# Patient Record
Sex: Female | Born: 1955 | Race: White | Hispanic: No | Smoking: Never smoker
Health system: Southern US, Community
[De-identification: ages and names within clinical notes are randomized; demographics above are authoritative.]

## PROBLEM LIST (undated history)

## (undated) DIAGNOSIS — M7731 Calcaneal spur, right foot: Secondary | ICD-10-CM

## (undated) DIAGNOSIS — Z923 Personal history of irradiation: Secondary | ICD-10-CM

## (undated) DIAGNOSIS — C801 Malignant (primary) neoplasm, unspecified: Secondary | ICD-10-CM

## (undated) DIAGNOSIS — Z853 Personal history of malignant neoplasm of breast: Secondary | ICD-10-CM

## (undated) DIAGNOSIS — E079 Disorder of thyroid, unspecified: Secondary | ICD-10-CM

## (undated) DIAGNOSIS — N6019 Diffuse cystic mastopathy of unspecified breast: Secondary | ICD-10-CM

## (undated) DIAGNOSIS — E039 Hypothyroidism, unspecified: Secondary | ICD-10-CM

## (undated) HISTORY — DX: Calcaneal spur, right foot: M77.31

## (undated) HISTORY — DX: Personal history of malignant neoplasm of breast: Z85.3

## (undated) HISTORY — DX: Malignant (primary) neoplasm, unspecified: C80.1

## (undated) HISTORY — DX: Diffuse cystic mastopathy of unspecified breast: N60.19

## (undated) HISTORY — DX: Disorder of thyroid, unspecified: E07.9

---
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
## 2004-12-28 ENCOUNTER — Ambulatory Visit: Payer: Self-pay | Admitting: Family Medicine

## 2005-05-21 HISTORY — PX: COLONOSCOPY: SHX174

## 2005-12-31 ENCOUNTER — Ambulatory Visit: Payer: Self-pay | Admitting: Family Medicine

## 2006-01-09 ENCOUNTER — Ambulatory Visit: Payer: Self-pay | Admitting: Gastroenterology

## 2006-05-21 HISTORY — PX: OTHER SURGICAL HISTORY: SHX169

## 2007-11-12 ENCOUNTER — Ambulatory Visit: Payer: Self-pay | Admitting: Family Medicine

## 2007-11-27 ENCOUNTER — Ambulatory Visit: Payer: Self-pay | Admitting: Family Medicine

## 2008-04-23 ENCOUNTER — Ambulatory Visit: Payer: Self-pay | Admitting: General Surgery

## 2008-05-21 DIAGNOSIS — E079 Disorder of thyroid, unspecified: Secondary | ICD-10-CM

## 2008-05-21 HISTORY — DX: Disorder of thyroid, unspecified: E07.9

## 2008-07-26 ENCOUNTER — Ambulatory Visit: Payer: Self-pay | Admitting: Family Medicine

## 2008-11-17 ENCOUNTER — Ambulatory Visit: Payer: Self-pay | Admitting: General Surgery

## 2009-01-31 ENCOUNTER — Observation Stay: Payer: Self-pay | Admitting: Internal Medicine

## 2009-02-10 ENCOUNTER — Ambulatory Visit: Payer: Self-pay | Admitting: Family Medicine

## 2009-05-23 ENCOUNTER — Ambulatory Visit: Payer: Self-pay | Admitting: General Surgery

## 2009-11-22 ENCOUNTER — Ambulatory Visit: Payer: Self-pay | Admitting: General Surgery

## 2010-05-21 DIAGNOSIS — C801 Malignant (primary) neoplasm, unspecified: Secondary | ICD-10-CM

## 2010-05-21 DIAGNOSIS — Z853 Personal history of malignant neoplasm of breast: Secondary | ICD-10-CM

## 2010-05-21 HISTORY — DX: Personal history of malignant neoplasm of breast: Z85.3

## 2010-05-21 HISTORY — PX: BREAST SURGERY: SHX581

## 2010-05-21 HISTORY — DX: Malignant (primary) neoplasm, unspecified: C80.1

## 2010-05-21 HISTORY — PX: BREAST BIOPSY: SHX20

## 2010-11-30 ENCOUNTER — Ambulatory Visit: Payer: Self-pay | Admitting: General Surgery

## 2010-12-04 ENCOUNTER — Ambulatory Visit: Payer: Self-pay | Admitting: General Surgery

## 2010-12-20 HISTORY — PX: BREAST LUMPECTOMY: SHX2

## 2010-12-21 ENCOUNTER — Ambulatory Visit: Payer: Self-pay | Admitting: General Surgery

## 2010-12-22 ENCOUNTER — Ambulatory Visit: Payer: Self-pay | Admitting: General Surgery

## 2010-12-25 LAB — PATHOLOGY REPORT

## 2011-01-01 ENCOUNTER — Ambulatory Visit: Payer: Self-pay | Admitting: Oncology

## 2011-01-20 ENCOUNTER — Ambulatory Visit: Payer: Self-pay | Admitting: Oncology

## 2011-02-19 ENCOUNTER — Ambulatory Visit: Payer: Self-pay | Admitting: Oncology

## 2011-06-04 ENCOUNTER — Ambulatory Visit: Payer: Self-pay | Admitting: Oncology

## 2011-06-21 ENCOUNTER — Ambulatory Visit: Payer: Self-pay | Admitting: General Surgery

## 2011-06-22 ENCOUNTER — Ambulatory Visit: Payer: Self-pay | Admitting: Oncology

## 2011-07-26 ENCOUNTER — Ambulatory Visit: Payer: Self-pay | Admitting: Oncology

## 2011-07-26 LAB — CBC CANCER CENTER
Basophil #: 0 x10 3/mm (ref 0.0–0.1)
Basophil %: 0.7 %
Eosinophil #: 0.2 x10 3/mm (ref 0.0–0.7)
Lymphocyte #: 1.6 x10 3/mm (ref 1.0–3.6)
Lymphocyte %: 29.2 %
MCH: 31.1 pg (ref 26.0–34.0)
MCHC: 34.1 g/dL (ref 32.0–36.0)
Monocyte %: 6.9 %
Neutrophil %: 60.1 %
Platelet: 228 x10 3/mm (ref 150–440)
RDW: 12.6 % (ref 11.5–14.5)

## 2011-07-26 LAB — COMPREHENSIVE METABOLIC PANEL
Albumin: 3.8 g/dL (ref 3.4–5.0)
Anion Gap: 7 (ref 7–16)
BUN: 19 mg/dL — ABNORMAL HIGH (ref 7–18)
Bilirubin,Total: 0.4 mg/dL (ref 0.2–1.0)
Calcium, Total: 9.3 mg/dL (ref 8.5–10.1)
Co2: 31 mmol/L (ref 21–32)
Creatinine: 1.19 mg/dL (ref 0.60–1.30)
EGFR (African American): >60
Glucose: 100 mg/dL — ABNORMAL HIGH (ref 65–99)
Osmolality: 284 (ref 275–301)
Potassium: 4.1 mmol/L (ref 3.5–5.1)
Sodium: 141 mmol/L (ref 136–145)

## 2011-07-27 LAB — CANCER ANTIGEN 27.29: CA 27.29: 21.4 U/mL (ref 0.0–38.6)

## 2011-08-20 ENCOUNTER — Ambulatory Visit: Payer: Self-pay | Admitting: Oncology

## 2011-12-04 ENCOUNTER — Ambulatory Visit: Payer: Self-pay | Admitting: Family Medicine

## 2011-12-24 ENCOUNTER — Ambulatory Visit: Payer: Self-pay | Admitting: General Surgery

## 2012-01-28 ENCOUNTER — Ambulatory Visit: Payer: Self-pay | Admitting: Oncology

## 2012-01-28 LAB — CBC CANCER CENTER
Basophil %: 0.7 %
Eosinophil #: 0.1 x10 3/mm (ref 0.0–0.7)
Eosinophil %: 2.3 %
HCT: 39.1 % (ref 35.0–47.0)
HGB: 12.8 g/dL (ref 12.0–16.0)
Lymphocyte %: 33.7 %
MCHC: 32.7 g/dL (ref 32.0–36.0)
Monocyte %: 5.8 %
Neutrophil #: 3.1 x10 3/mm (ref 1.4–6.5)
Neutrophil %: 57.5 %
RBC: 4.28 10*6/uL (ref 3.80–5.20)
RDW: 13 % (ref 11.5–14.5)

## 2012-01-28 LAB — COMPREHENSIVE METABOLIC PANEL
Albumin: 3.9 g/dL (ref 3.4–5.0)
Alkaline Phosphatase: 106 U/L (ref 50–136)
BUN: 12 mg/dL (ref 7–18)
Bilirubin,Total: 0.4 mg/dL (ref 0.2–1.0)
Chloride: 104 mmol/L (ref 98–107)
Creatinine: 0.93 mg/dL (ref 0.60–1.30)
Glucose: 98 mg/dL (ref 65–99)
SGPT (ALT): 29 U/L (ref 12–78)
Sodium: 142 mmol/L (ref 136–145)
Total Protein: 7.6 g/dL (ref 6.4–8.2)

## 2012-01-29 LAB — CANCER ANTIGEN 27.29: CA 27.29: 7.7 U/mL (ref 0.0–38.6)

## 2012-02-19 ENCOUNTER — Ambulatory Visit: Payer: Self-pay | Admitting: Oncology

## 2012-06-26 ENCOUNTER — Ambulatory Visit: Payer: Self-pay | Admitting: General Surgery

## 2012-07-01 ENCOUNTER — Ambulatory Visit: Payer: Self-pay | Admitting: General Surgery

## 2012-07-19 ENCOUNTER — Ambulatory Visit: Payer: Self-pay | Admitting: Oncology

## 2012-07-28 LAB — COMPREHENSIVE METABOLIC PANEL
BUN: 22 mg/dL — ABNORMAL HIGH (ref 7–18)
Calcium, Total: 9.3 mg/dL (ref 8.5–10.1)
Chloride: 103 mmol/L (ref 98–107)
Creatinine: 1 mg/dL (ref 0.60–1.30)
EGFR (Non-African Amer.): >60
Glucose: 95 mg/dL (ref 65–99)
Osmolality: 284 (ref 275–301)
Potassium: 4.4 mmol/L (ref 3.5–5.1)
Total Protein: 7.6 g/dL (ref 6.4–8.2)

## 2012-07-28 LAB — CBC CANCER CENTER
Eosinophil #: 0.1 x10 3/mm (ref 0.0–0.7)
Eosinophil %: 1.6 %
Lymphocyte #: 1.5 x10 3/mm (ref 1.0–3.6)
Lymphocyte %: 28.1 %
MCH: 29.9 pg (ref 26.0–34.0)
MCHC: 33.1 g/dL (ref 32.0–36.0)
MCV: 90 fL (ref 80–100)
Monocyte #: 0.5 x10 3/mm (ref 0.2–0.9)
Monocyte %: 9.1 %
Neutrophil #: 3.2 x10 3/mm (ref 1.4–6.5)
Neutrophil %: 60.2 %
RDW: 12.8 % (ref 11.5–14.5)

## 2012-07-29 LAB — CANCER ANTIGEN 27.29: CA 27.29: 11.7 U/mL (ref 0.0–38.6)

## 2012-08-19 ENCOUNTER — Ambulatory Visit: Payer: Self-pay | Admitting: Oncology

## 2012-12-30 ENCOUNTER — Encounter: Payer: Self-pay | Admitting: General Surgery

## 2012-12-30 ENCOUNTER — Ambulatory Visit: Payer: Self-pay | Admitting: General Surgery

## 2012-12-30 LAB — HM MAMMOGRAPHY: HM MAMMO: NORMAL (ref 0–4)

## 2013-01-13 ENCOUNTER — Ambulatory Visit (INDEPENDENT_AMBULATORY_CARE_PROVIDER_SITE_OTHER): Payer: BC Managed Care – PPO | Admitting: General Surgery

## 2013-01-13 ENCOUNTER — Encounter: Payer: Self-pay | Admitting: General Surgery

## 2013-01-13 VITALS — BP 144/80 | HR 76 | Resp 12 | Ht 63.0 in | Wt 189.0 lb

## 2013-01-13 DIAGNOSIS — Z853 Personal history of malignant neoplasm of breast: Secondary | ICD-10-CM

## 2013-01-13 NOTE — Patient Instructions (Addendum)
Patient to return in one year.Patient to take an baby aspirin daily.

## 2013-01-13 NOTE — Progress Notes (Signed)
Patient ID: Deanna Holder, female   DOB: 28-Jun-1955, 58 y.o.   MRN: [id number]  Chief Complaint  Patient presents with  . Follow-up    mammogram    HPI Deanna Holder is a 57 y.o. female who presents for a breast evaluation. The most recent mammogram was done on 12/30/12 cat 2.  Patient does perform regular self breast checks and gets regular mammograms done.    HPI  Past Medical History  Diagnosis Date  . 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 Diffuse cystic mastopathy   . Hypertension   . Thyroid disease 2010  . Personal history of malignant neoplasm of breast 2012    Past Surgical History  Procedure Laterality Date  . Colonoscopy  2007    Dr. Servando Snare  . Tooth extraction  1970  . Wisdom teeth removal  2008  . Cesarean section  1985  . Breast surgery Left 2012    mammosite balloon placement  . Breast lumpectomy Left Aug 2012    ER/PR POS,HER 2 NEG    Family History  Problem Relation Age of Onset  . Heart attack Father   . Thyroid disease Mother     Social History History  Substance Use Topics  . Smoking status: Never Smoker   . Smokeless tobacco: Never Used  . Alcohol Use: No    Allergies  Allergen Reactions  . Ampicillin Anaphylaxis  . Vibramycin [Doxycycline Calcium] Anaphylaxis    Current Outpatient Prescriptions  Medication Sig Dispense Refill  . atorvastatin (LIPITOR) 20 MG tablet Take 20 mg by mouth daily.       Marland Kitchen letrozole (FEMARA) 2.5 MG tablet Take 2.5 mg by mouth daily.       Marland Kitchen levothyroxine (SYNTHROID, LEVOTHROID) 50 MCG tablet Take 50 mcg by mouth daily before breakfast.       . Vitamin D, Ergocalciferol, (DRISDOL) 50000 UNITS CAPS capsule every 7 (seven) days.        No current facility-administered medications for this visit.    Review of Systems Review of Systems  Blood pressure 144/80, pulse 76, resp. rate 12, height 5\' 3"  (1.6 m), weight 189 lb (85.73 kg).  Physical Exam Physical Exam  Constitutional: She is oriented to person,  place, and time. She appears well-developed and well-nourished.  Eyes: Conjunctivae are normal. No scleral icterus.  Neck: Neck supple. No tracheal deviation present. No mass and no thyromegaly present.  Cardiovascular: Normal rate, regular rhythm, S1 normal, normal heart sounds, intact distal pulses and normal pulses.   Pulses:      Dorsalis pedis pulses are 2+ on the right side, and 2+ on the left side.       Posterior tibial pulses are 2+ on the right side, and 2+ on the left side.  No edema  Pulmonary/Chest: Breath sounds normal. Right breast exhibits no inverted nipple, no mass, no nipple discharge, no skin change and no tenderness. Left breast exhibits no inverted nipple, no mass, no nipple discharge, no skin change and no tenderness.  Left lumpectomy site little firmness upper inner quadrant.  Abdominal: Soft. Normal appearance and bowel sounds are normal. There is no hepatosplenomegaly. There is no tenderness.  Lymphadenopathy:    She has no cervical adenopathy.    She has no axillary adenopathy.  Neurological: She is alert and oriented to person, place, and time.  Skin: Skin is warm and dry.    Data Reviewed Mammogram reviewed   Assessment    Stable exam. 2 yrs post left  breast cancer treatment. Currently on Letrazole.    Plan  Patient to return in one year bilateral diagnotic mammogram.       [name] 01/13/2013, 8:58 PM

## 2013-01-26 ENCOUNTER — Ambulatory Visit: Payer: Self-pay | Admitting: Oncology

## 2013-01-26 LAB — CBC CANCER CENTER
Basophil #: 0 x10 3/mm (ref 0.0–0.1)
Eosinophil #: 0.2 x10 3/mm (ref 0.0–0.7)
HGB: 13.4 g/dL (ref 12.0–16.0)
MCH: 30.2 pg (ref 26.0–34.0)
MCV: 91 fL (ref 80–100)
Monocyte #: 0.5 x10 3/mm (ref 0.2–0.9)
Monocyte %: 8.6 %
Neutrophil #: 3.4 x10 3/mm (ref 1.4–6.5)
Platelet: 223 x10 3/mm (ref 150–440)
WBC: 5.7 x10 3/mm (ref 3.6–11.0)

## 2013-01-26 LAB — COMPREHENSIVE METABOLIC PANEL
Albumin: 3.9 g/dL (ref 3.4–5.0)
Alkaline Phosphatase: 143 U/L — ABNORMAL HIGH (ref 50–136)
Anion Gap: 7 (ref 7–16)
BUN: 15 mg/dL (ref 7–18)
Calcium, Total: 9.9 mg/dL (ref 8.5–10.1)
Chloride: 102 mmol/L (ref 98–107)
Creatinine: 0.99 mg/dL (ref 0.60–1.30)
EGFR (African American): >60
EGFR (Non-African Amer.): >60
Osmolality: 281 (ref 275–301)
Potassium: 4.7 mmol/L (ref 3.5–5.1)
SGPT (ALT): 23 U/L (ref 12–78)
Sodium: 140 mmol/L (ref 136–145)

## 2013-02-18 ENCOUNTER — Ambulatory Visit: Payer: Self-pay | Admitting: Oncology

## 2013-08-05 ENCOUNTER — Ambulatory Visit: Payer: Self-pay | Admitting: Oncology

## 2013-08-06 LAB — CBC CANCER CENTER
Basophil #: 0 x10 3/mm (ref 0.0–0.1)
Basophil %: 0.5 %
Eosinophil #: 0.1 x10 3/mm (ref 0.0–0.7)
Eosinophil %: 2 %
HCT: 40.2 % (ref 35.0–47.0)
HGB: 13.1 g/dL (ref 12.0–16.0)
LYMPHS ABS: 1.9 x10 3/mm (ref 1.0–3.6)
LYMPHS PCT: 32.3 %
MCH: 29.7 pg (ref 26.0–34.0)
MCHC: 32.5 g/dL (ref 32.0–36.0)
MCV: 91 fL (ref 80–100)
MONOS PCT: 10 %
Monocyte #: 0.6 x10 3/mm (ref 0.2–0.9)
Neutrophil #: 3.3 x10 3/mm (ref 1.4–6.5)
Neutrophil %: 55.2 %
PLATELETS: 232 x10 3/mm (ref 150–440)
RBC: 4.4 10*6/uL (ref 3.80–5.20)
RDW: 12.7 % (ref 11.5–14.5)
WBC: 5.9 x10 3/mm (ref 3.6–11.0)

## 2013-08-06 LAB — COMPREHENSIVE METABOLIC PANEL
ALT: 22 U/L (ref 12–78)
ANION GAP: 8 (ref 7–16)
Albumin: 3.7 g/dL (ref 3.4–5.0)
Alkaline Phosphatase: 117 U/L
BILIRUBIN TOTAL: 0.4 mg/dL (ref 0.2–1.0)
BUN: 17 mg/dL (ref 7–18)
CALCIUM: 9.7 mg/dL (ref 8.5–10.1)
CHLORIDE: 103 mmol/L (ref 98–107)
Co2: 30 mmol/L (ref 21–32)
Creatinine: 0.89 mg/dL (ref 0.60–1.30)
EGFR (African American): >60
Glucose: 101 mg/dL — ABNORMAL HIGH (ref 65–99)
Osmolality: 283 (ref 275–301)
Potassium: 4.3 mmol/L (ref 3.5–5.1)
SGOT(AST): 16 U/L (ref 15–37)
SODIUM: 141 mmol/L (ref 136–145)
TOTAL PROTEIN: 7.5 g/dL (ref 6.4–8.2)

## 2013-08-08 LAB — CANCER ANTIGEN 27.29: CA 27.29: 9.8 U/mL (ref 0.0–38.6)

## 2013-08-19 ENCOUNTER — Ambulatory Visit: Payer: Self-pay | Admitting: Oncology

## 2013-12-24 ENCOUNTER — Ambulatory Visit: Payer: Self-pay | Admitting: Family Medicine

## 2013-12-24 LAB — HM DEXA SCAN

## 2013-12-31 ENCOUNTER — Encounter: Payer: Self-pay | Admitting: General Surgery

## 2014-01-14 ENCOUNTER — Encounter: Payer: Self-pay | Admitting: General Surgery

## 2014-01-20 ENCOUNTER — Ambulatory Visit (INDEPENDENT_AMBULATORY_CARE_PROVIDER_SITE_OTHER): Payer: BC Managed Care – PPO | Admitting: General Surgery

## 2014-01-20 ENCOUNTER — Encounter: Payer: Self-pay | Admitting: General Surgery

## 2014-01-20 VITALS — BP 130/70 | HR 74 | Resp 12 | Ht 64.0 in | Wt 189.0 lb

## 2014-01-20 DIAGNOSIS — C50212 Malignant neoplasm of upper-inner quadrant of left female breast: Secondary | ICD-10-CM

## 2014-01-20 DIAGNOSIS — C50219 Malignant neoplasm of upper-inner quadrant of unspecified female breast: Secondary | ICD-10-CM

## 2014-01-20 NOTE — Patient Instructions (Addendum)
The patient has been asked to return to the office in one year with a bilateral diagnostic mammogram.Continue self breast exams. Call office for any new breast issues or concerns.  

## 2014-01-20 NOTE — Progress Notes (Signed)
Patient ID: Deanna Holder, female   DOB: Aug 17, 1955, 58 y.o.   MRN: [id number]  Chief Complaint  Patient presents with  . Follow-up    mammogram    HPI [name] is a 58 y.o. female who presents for a breast cancer follow up. The most recent mammogram was done on 12/30/13 at [facility].  Patient does perform regular self breast checks and gets regular mammograms done.    HPI  Past Medical History  Diagnosis Date  . 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 Diffuse cystic mastopathy   . Hypertension   . Thyroid disease 2010  . Personal history of malignant neoplasm of breast 2012    Past Surgical History  Procedure Laterality Date  . Colonoscopy  2007    Dr. Allen Norris  . Tooth extraction  1970  . Wisdom teeth removal  2008  . Cesarean section  1985  . Breast surgery Left 2012    mammosite balloon placement  . Breast lumpectomy Left Aug 2012    ER/PR POS,HER 2 NEG    Family History  Problem Relation Age of Onset  . Heart attack Father   . Thyroid disease Mother     Social History History  Substance Use Topics  . Smoking status: Never Smoker   . Smokeless tobacco: Never Used  . Alcohol Use: No    Allergies  Allergen Reactions  . Ampicillin Anaphylaxis  . Vibramycin [Doxycycline Calcium] Anaphylaxis    Current Outpatient Prescriptions  Medication Sig Dispense Refill  . atorvastatin (LIPITOR) 20 MG tablet Take 20 mg by mouth daily.       Marland Kitchen letrozole (FEMARA) 2.5 MG tablet Take 2.5 mg by mouth daily.       Marland Kitchen levothyroxine (SYNTHROID, LEVOTHROID) 50 MCG tablet Take 50 mcg by mouth daily before breakfast.       . Vitamin D, Ergocalciferol, (DRISDOL) 50000 UNITS CAPS capsule every 7 (seven) days.        No current facility-administered medications for this visit.    Review of Systems Review of Systems  Constitutional: Negative.   Respiratory: Negative.   Cardiovascular: Negative.     Blood pressure 130/70, pulse 74, resp. rate 12, height 5\' 4"  (1.626 m), weight  189 lb (85.73 kg).  Physical Exam Physical Exam  Constitutional: She is oriented to person, place, and time. She appears well-developed and well-nourished.  Eyes: Conjunctivae are normal. No scleral icterus.  Neck: Neck supple. No mass and no thyromegaly present.  Cardiovascular: Normal rate, regular rhythm and normal heart sounds.   Pulmonary/Chest: Effort normal and breath sounds normal. Right breast exhibits no inverted nipple, no mass, no nipple discharge, no skin change and no tenderness. Left breast exhibits no inverted nipple, no mass, no nipple discharge, no skin change and no tenderness.    Abdominal: Soft. Normal appearance and bowel sounds are normal.  Lymphadenopathy:    She has no cervical adenopathy.    She has no axillary adenopathy.  Neurological: She is alert and oriented to person, place, and time.  Skin: Skin is warm and dry.    Data Reviewed Mammogram reviewed   Assessment    Stable exam . Three years post lumpectomy, sentral node biopsy and radiation. Currently on Letrazole.     Plan    The patient has been asked to return to the office in one year with a bilateral diagnostic mammogram.        Eshal Propps G 01/21/2014, 12:16 PM

## 2014-01-21 ENCOUNTER — Encounter: Payer: Self-pay | Admitting: General Surgery

## 2014-02-04 ENCOUNTER — Ambulatory Visit: Payer: Self-pay | Admitting: Oncology

## 2014-02-04 LAB — CBC CANCER CENTER
BASOS ABS: 0 x10 3/mm (ref 0.0–0.1)
Basophil %: 0.7 %
Eosinophil #: 0.1 x10 3/mm (ref 0.0–0.7)
Eosinophil %: 1.7 %
HCT: 41.3 % (ref 35.0–47.0)
HGB: 13.5 g/dL (ref 12.0–16.0)
Lymphocyte #: 1.6 x10 3/mm (ref 1.0–3.6)
Lymphocyte %: 30.2 %
MCH: 30.1 pg (ref 26.0–34.0)
MCHC: 32.7 g/dL (ref 32.0–36.0)
MCV: 92 fL (ref 80–100)
MONO ABS: 0.4 x10 3/mm (ref 0.2–0.9)
Monocyte %: 7.2 %
Neutrophil #: 3.2 x10 3/mm (ref 1.4–6.5)
Neutrophil %: 60.2 %
PLATELETS: 266 x10 3/mm (ref 150–440)
RBC: 4.48 10*6/uL (ref 3.80–5.20)
RDW: 13 % (ref 11.5–14.5)
WBC: 5.4 x10 3/mm (ref 3.6–11.0)

## 2014-02-04 LAB — COMPREHENSIVE METABOLIC PANEL
ALBUMIN: 3.9 g/dL (ref 3.4–5.0)
Alkaline Phosphatase: 114 U/L
Anion Gap: 6 — ABNORMAL LOW (ref 7–16)
BUN: 19 mg/dL — AB (ref 7–18)
Bilirubin,Total: 0.5 mg/dL (ref 0.2–1.0)
CHLORIDE: 103 mmol/L (ref 98–107)
CO2: 32 mmol/L (ref 21–32)
Calcium, Total: 9.7 mg/dL (ref 8.5–10.1)
Creatinine: 0.98 mg/dL (ref 0.60–1.30)
EGFR (Non-African Amer.): >60
GLUCOSE: 100 mg/dL — AB (ref 65–99)
Osmolality: 284 (ref 275–301)
POTASSIUM: 4.6 mmol/L (ref 3.5–5.1)
SGOT(AST): 19 U/L (ref 15–37)
SGPT (ALT): 28 U/L
Sodium: 141 mmol/L (ref 136–145)
TOTAL PROTEIN: 7.7 g/dL (ref 6.4–8.2)

## 2014-02-08 LAB — CANCER ANTIGEN 27.29: CA 27.29: 12.1 U/mL (ref 0.0–38.6)

## 2014-02-18 ENCOUNTER — Ambulatory Visit: Payer: Self-pay | Admitting: Oncology

## 2014-03-22 ENCOUNTER — Encounter: Payer: Self-pay | Admitting: General Surgery

## 2014-08-05 ENCOUNTER — Ambulatory Visit
Admit: 2014-08-05 | Disposition: A | Discharge status: Home / Self Care | Payer: Self-pay | Attending: Oncology | Admitting: Oncology

## 2014-08-20 ENCOUNTER — Ambulatory Visit
Admit: 2014-08-20 | Disposition: A | Discharge status: Home / Self Care | Payer: Self-pay | Attending: Oncology | Admitting: Oncology

## 2014-11-17 ENCOUNTER — Other Ambulatory Visit: Payer: Self-pay

## 2014-11-17 DIAGNOSIS — C50212 Malignant neoplasm of upper-inner quadrant of left female breast: Secondary | ICD-10-CM

## 2014-12-07 ENCOUNTER — Encounter: Payer: Self-pay | Admitting: Family Medicine

## 2014-12-07 ENCOUNTER — Other Ambulatory Visit: Payer: Self-pay | Admitting: Family Medicine

## 2014-12-07 ENCOUNTER — Ambulatory Visit (INDEPENDENT_AMBULATORY_CARE_PROVIDER_SITE_OTHER): Payer: BC Managed Care – PPO | Admitting: Family Medicine

## 2014-12-07 VITALS — BP 122/84 | HR 79 | Temp 98.8°F | Resp 16 | Ht 64.0 in | Wt 197.6 lb

## 2014-12-07 DIAGNOSIS — Z124 Encounter for screening for malignant neoplasm of cervix: Secondary | ICD-10-CM | POA: Diagnosis not present

## 2014-12-07 DIAGNOSIS — Z0000 Encounter for general adult medical examination without abnormal findings: Secondary | ICD-10-CM | POA: Diagnosis not present

## 2014-12-07 NOTE — Patient Instructions (Signed)

## 2014-12-07 NOTE — Progress Notes (Signed)
Name: Deanna Holder   MRN: [id number]    DOB: [birth year]   Date:12/07/2014       Progress Note  Subjective  Chief Complaint  Chief Complaint  Patient presents with  . Annual Exam    HPI  59 year old white female presenting for annual H&P  Past Medical History  Diagnosis Date  . 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 Diffuse cystic mastopathy   . Hypertension   . Thyroid disease 2010  . Personal history of malignant neoplasm of breast 2012    History  Substance Use Topics  . Smoking status: Never Smoker   . Smokeless tobacco: Never Used  . Alcohol Use: No     Current outpatient prescriptions:  .  atorvastatin (LIPITOR) 20 MG tablet, Take 20 mg by mouth daily. , Disp: , Rfl:  .  letrozole (FEMARA) 2.5 MG tablet, Take 2.5 mg by mouth daily. , Disp: , Rfl:  .  levothyroxine (SYNTHROID, LEVOTHROID) 50 MCG tablet, Take 50 mcg by mouth daily before breakfast. , Disp: , Rfl:  .  Vitamin D, Ergocalciferol, (DRISDOL) 50000 UNITS CAPS capsule, every 7 (seven) days. , Disp: , Rfl:   Allergies  Allergen Reactions  . Ampicillin Anaphylaxis  . Vibramycin [Doxycycline Calcium] Anaphylaxis    Review of Systems  Constitutional: Negative for fever, chills and weight loss.  HENT: Negative for congestion, hearing loss, sore throat and tinnitus.   Eyes: Negative for blurred vision, double vision and redness.  Respiratory: Negative for cough, hemoptysis and shortness of breath.   Cardiovascular: Negative for chest pain, palpitations, orthopnea, claudication and leg swelling.  Gastrointestinal: Negative for heartburn, nausea, vomiting, diarrhea, constipation and blood in stool.  Genitourinary: Negative for dysuria, urgency, frequency and hematuria.  Musculoskeletal: Negative for myalgias, back pain, joint pain, falls and neck pain.  Skin: Negative for itching.  Neurological: Negative for dizziness, tingling, tremors, focal weakness, seizures, loss of consciousness, weakness and headaches.   Endo/Heme/Allergies: Does not bruise/bleed easily.  Psychiatric/Behavioral: Negative for depression and substance abuse. The patient is not nervous/anxious and does not have insomnia.      Objective  Filed Vitals:   12/07/14 1354  BP: 122/84  Pulse: 79  Temp: 98.8 F (37.1 C)  Resp: 16  Height: 5\' 4"  (1.626 m)  Weight: 197 lb 9 oz (89.614 kg)  SpO2: 97%     Physical Exam  Constitutional: She is oriented to person, place, and time and well-developed, well-nourished, and in no distress.  HENT:  Head: Normocephalic.  Eyes: EOM are normal. Pupils are equal, round, and reactive to light.  Neck: Normal range of motion. No thyromegaly present.  Cardiovascular: Normal rate, regular rhythm and normal heart sounds.   No murmur heard. Pulmonary/Chest: Effort normal and breath sounds normal.  Breast exam and mammogram handled by her general surgeon  Abdominal: Soft. Bowel sounds are normal.  Genitourinary: Vagina normal, uterus normal, cervix normal, right adnexa normal and left adnexa normal. Guaiac negative stool. No vaginal discharge found.  Musculoskeletal: Normal range of motion. She exhibits no edema.  Neurological: She is alert and oriented to person, place, and time. No cranial nerve deficit. Gait normal.  Skin: Skin is warm and dry. No rash noted.  Psychiatric: Memory and affect normal.      Assessment & Plan  1. Annual physical exam  - CBC - Comprehensive metabolic panel - Lipid Profile - TSH - POC Hemoccult Bld/Stl (1-Cd Office Dx) - POC Hemoccult Bld/Stl (3-Cd Home Screen); Future  2. Pap smear for cervical cancer screening  - Cytology - PAP

## 2014-12-13 LAB — PAP LB, RFX HPV ASCU: PAP SMEAR COMMENT: 0

## 2014-12-17 LAB — COMPREHENSIVE METABOLIC PANEL
A/G RATIO: 1.7 (ref 1.1–2.5)
ALT: 20 IU/L (ref 0–32)
AST: 15 IU/L (ref 0–40)
Albumin: 4.3 g/dL (ref 3.5–5.5)
Alkaline Phosphatase: 116 IU/L (ref 39–117)
BUN/Creatinine Ratio: 19 (ref 9–23)
BUN: 16 mg/dL (ref 6–24)
Bilirubin Total: 0.4 mg/dL (ref 0.0–1.2)
CALCIUM: 10.1 mg/dL (ref 8.7–10.2)
CHLORIDE: 99 mmol/L (ref 97–108)
CO2: 23 mmol/L (ref 18–29)
Creatinine, Ser: 0.85 mg/dL (ref 0.57–1.00)
GFR calc Af Amer: 87 mL/min/{1.73_m2} (ref >59)
GFR calc non Af Amer: 75 mL/min/{1.73_m2} (ref >59)
Globulin, Total: 2.5 g/dL (ref 1.5–4.5)
Glucose: 105 mg/dL — ABNORMAL HIGH (ref 65–99)
Potassium: 4.7 mmol/L (ref 3.5–5.2)
Sodium: 140 mmol/L (ref 134–144)
TOTAL PROTEIN: 6.8 g/dL (ref 6.0–8.5)

## 2014-12-17 LAB — CBC
HEMATOCRIT: 38.6 % (ref 34.0–46.6)
HEMOGLOBIN: 13.5 g/dL (ref 11.1–15.9)
MCH: 30.8 pg (ref 26.6–33.0)
MCHC: 35 g/dL (ref 31.5–35.7)
MCV: 88 fL (ref 79–97)
Platelets: 253 10*3/uL (ref 150–379)
RBC: 4.39 x10E6/uL (ref 3.77–5.28)
RDW: 12.9 % (ref 12.3–15.4)
WBC: 6.6 10*3/uL (ref 3.4–10.8)

## 2014-12-17 LAB — TSH: TSH: 3.15 u[IU]/mL (ref 0.450–4.500)

## 2014-12-17 LAB — LIPID PANEL
Chol/HDL Ratio: 3.8 ratio units (ref 0.0–4.4)
Cholesterol, Total: 229 mg/dL — ABNORMAL HIGH (ref 100–199)
HDL: 61 mg/dL (ref >39)
LDL Calculated: 146 mg/dL — ABNORMAL HIGH (ref 0–99)
TRIGLYCERIDES: 112 mg/dL (ref 0–149)
VLDL Cholesterol Cal: 22 mg/dL (ref 5–40)

## 2014-12-27 ENCOUNTER — Other Ambulatory Visit: Payer: Self-pay | Admitting: Family Medicine

## 2015-01-11 ENCOUNTER — Ambulatory Visit (INDEPENDENT_AMBULATORY_CARE_PROVIDER_SITE_OTHER): Payer: BC Managed Care – PPO | Admitting: General Surgery

## 2015-01-11 ENCOUNTER — Encounter: Payer: Self-pay | Admitting: General Surgery

## 2015-01-11 VITALS — BP 118/68 | HR 82 | Resp 16 | Ht 64.0 in | Wt 199.0 lb

## 2015-01-11 DIAGNOSIS — C50212 Malignant neoplasm of upper-inner quadrant of left female breast: Secondary | ICD-10-CM | POA: Diagnosis not present

## 2015-01-11 NOTE — Progress Notes (Signed)
Patient ID: Deanna Holder, female   DOB: 08-Aug-1955, 59 y.o.   MRN: [id number]  Chief Complaint  Patient presents with  . Follow-up    Bilateral mammogram    HPI Deanna Holder is a 59 y.o. female.  who presents for her follow up breast cancer and  breast evaluation. The most recent mammogram was done on 12-31-14.  Patient does perform regular self breast checks and gets regular mammograms done.  No new breast issues. She is tolerating the Femara.  HPI  Past Medical History  Diagnosis Date  . 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 Diffuse cystic mastopathy   . Hypertension   . Thyroid disease 2010  . Personal history of malignant neoplasm of breast 2012    Past Surgical History  Procedure Laterality Date  . Colonoscopy  2007    Dr. Allen Norris  . Tooth extraction  1970  . Wisdom teeth removal  2008  . Cesarean section  1985  . Breast surgery Left 2012    mammosite balloon placement  . Breast lumpectomy Left Aug 2012    ER/PR POS,HER 2 NEG    Family History  Problem Relation Age of Onset  . Heart attack Father   . Thyroid disease Mother     Social History Social History  Substance Use Topics  . Smoking status: Never Smoker   . Smokeless tobacco: Never Used  . Alcohol Use: No    Allergies  Allergen Reactions  . Ampicillin Anaphylaxis  . Vibramycin [Doxycycline Calcium] Anaphylaxis    Current Outpatient Prescriptions  Medication Sig Dispense Refill  . letrozole (FEMARA) 2.5 MG tablet Take 2.5 mg by mouth daily.     Marland Kitchen levothyroxine (SYNTHROID, LEVOTHROID) 50 MCG tablet TAKE 1 TABLET BY MOUTH EVERY DAY ON AN EMPTY STOMACH 30 tablet 5  . Vitamin D, Ergocalciferol, (DRISDOL) 50000 UNITS CAPS capsule every 7 (seven) days.      No current facility-administered medications for this visit.    Review of Systems Review of Systems  Constitutional: Negative.   Respiratory: Negative.   Cardiovascular: Negative.     Blood pressure 118/68, pulse 82, resp. rate 16,  height 5\' 4"  (1.626 m), weight 199 lb (90.266 kg).  Physical Exam Physical Exam  Constitutional: She is oriented to person, place, and time. She appears well-developed and well-nourished.  HENT:  Mouth/Throat: Oropharynx is clear and moist.  Eyes: Conjunctivae are normal. No scleral icterus.  Neck: Neck supple.  Cardiovascular: Normal rate, regular rhythm and normal heart sounds.   Pulmonary/Chest: Effort normal and breath sounds normal. Right breast exhibits no inverted nipple, no mass, no nipple discharge, no skin change and no tenderness. Left breast exhibits no inverted nipple, no mass, no nipple discharge, no skin change and no tenderness.    Abdominal: Soft. Normal appearance. There is no hepatomegaly. There is no tenderness.  Lymphadenopathy:    She has no cervical adenopathy.    She has no axillary adenopathy.  Neurological: She is alert and oriented to person, place, and time.  Skin: Skin is warm and dry.  Psychiatric: Her behavior is normal.    Data Reviewed  Mammogram reviewed and stable.  Assessment    Stable exam . Four years post lumpectomy, sentral node biopsy and radiation. Currently on Letrazole.        Plan    The patient has been asked to return to the office in one year with a bilateral diagnostic mammogram.  PCP:  Kathlene November 01/11/2015, 3:52 PM

## 2015-01-11 NOTE — Patient Instructions (Addendum)
The patient is aware to call back for any questions or concerns. Continue self breast exams. Call office for any new breast issues or concerns. The patient has been asked to return to the office in one year with a bilateral diagnostic mammogram.

## 2015-02-04 ENCOUNTER — Other Ambulatory Visit: Payer: Self-pay | Admitting: *Deleted

## 2015-02-04 DIAGNOSIS — Z853 Personal history of malignant neoplasm of breast: Secondary | ICD-10-CM

## 2015-02-07 ENCOUNTER — Inpatient Hospital Stay: Payer: BC Managed Care – PPO

## 2015-02-07 ENCOUNTER — Encounter: Payer: Self-pay | Admitting: Oncology

## 2015-02-07 ENCOUNTER — Inpatient Hospital Stay: Payer: BC Managed Care – PPO | Attending: Oncology | Admitting: Oncology

## 2015-02-07 VITALS — BP 140/85 | HR 72 | Temp 97.2°F | Wt 202.4 lb

## 2015-02-07 DIAGNOSIS — E039 Hypothyroidism, unspecified: Secondary | ICD-10-CM | POA: Diagnosis not present

## 2015-02-07 DIAGNOSIS — Z79899 Other long term (current) drug therapy: Secondary | ICD-10-CM | POA: Insufficient documentation

## 2015-02-07 DIAGNOSIS — Z923 Personal history of irradiation: Secondary | ICD-10-CM | POA: Insufficient documentation

## 2015-02-07 DIAGNOSIS — Z170 Estrogen receptor positive status [ER+]: Secondary | ICD-10-CM | POA: Diagnosis not present

## 2015-02-07 DIAGNOSIS — Z79811 Long term (current) use of aromatase inhibitors: Secondary | ICD-10-CM | POA: Insufficient documentation

## 2015-02-07 DIAGNOSIS — K219 Gastro-esophageal reflux disease without esophagitis: Secondary | ICD-10-CM | POA: Insufficient documentation

## 2015-02-07 DIAGNOSIS — I10 Essential (primary) hypertension: Secondary | ICD-10-CM | POA: Insufficient documentation

## 2015-02-07 DIAGNOSIS — C50212 Malignant neoplasm of upper-inner quadrant of left female breast: Secondary | ICD-10-CM | POA: Diagnosis not present

## 2015-02-07 DIAGNOSIS — Z23 Encounter for immunization: Secondary | ICD-10-CM | POA: Insufficient documentation

## 2015-02-07 DIAGNOSIS — E669 Obesity, unspecified: Secondary | ICD-10-CM | POA: Insufficient documentation

## 2015-02-07 DIAGNOSIS — Z853 Personal history of malignant neoplasm of breast: Secondary | ICD-10-CM

## 2015-02-07 DIAGNOSIS — Z780 Asymptomatic menopausal state: Secondary | ICD-10-CM | POA: Insufficient documentation

## 2015-02-07 DIAGNOSIS — Z1159 Encounter for screening for other viral diseases: Secondary | ICD-10-CM | POA: Insufficient documentation

## 2015-02-07 DIAGNOSIS — J40 Bronchitis, not specified as acute or chronic: Secondary | ICD-10-CM | POA: Insufficient documentation

## 2015-02-07 LAB — CBC WITH DIFFERENTIAL/PLATELET
Basophils Absolute: 0.1 10*3/uL (ref 0–0.1)
Basophils Relative: 1 %
Eosinophils Absolute: 0.2 10*3/uL (ref 0–0.7)
Eosinophils Relative: 3 %
HEMATOCRIT: 38.6 % (ref 35.0–47.0)
Hemoglobin: 12.9 g/dL (ref 12.0–16.0)
Lymphocytes Relative: 26 %
Lymphs Abs: 1.5 10*3/uL (ref 1.0–3.6)
MCH: 30 pg (ref 26.0–34.0)
MCHC: 33.4 g/dL (ref 32.0–36.0)
MCV: 90 fL (ref 80.0–100.0)
MONO ABS: 0.5 10*3/uL (ref 0.2–0.9)
MONOS PCT: 8 %
NEUTROS ABS: 3.7 10*3/uL (ref 1.4–6.5)
Neutrophils Relative %: 62 %
PLATELETS: 226 10*3/uL (ref 150–440)
RBC: 4.29 MIL/uL (ref 3.80–5.20)
RDW: 13.1 % (ref 11.5–14.5)
WBC: 5.9 10*3/uL (ref 3.6–11.0)

## 2015-02-07 LAB — COMPREHENSIVE METABOLIC PANEL
ALBUMIN: 4 g/dL (ref 3.5–5.0)
ALT: 16 U/L (ref 14–54)
ANION GAP: 5 (ref 5–15)
AST: 19 U/L (ref 15–41)
Alkaline Phosphatase: 113 U/L (ref 38–126)
BILIRUBIN TOTAL: 0.5 mg/dL (ref 0.3–1.2)
BUN: 16 mg/dL (ref 6–20)
CHLORIDE: 107 mmol/L (ref 101–111)
CO2: 26 mmol/L (ref 22–32)
Calcium: 8.7 mg/dL — ABNORMAL LOW (ref 8.9–10.3)
Creatinine, Ser: 0.73 mg/dL (ref 0.44–1.00)
GFR calc Af Amer: >60 mL/min (ref >60)
Glucose, Bld: 102 mg/dL — ABNORMAL HIGH (ref 65–99)
POTASSIUM: 4 mmol/L (ref 3.5–5.1)
Sodium: 138 mmol/L (ref 135–145)
Total Protein: 7.3 g/dL (ref 6.5–8.1)

## 2015-02-07 MED ORDER — LETROZOLE 2.5 MG PO TABS: 2.5000 mg | ORAL_TABLET | Freq: Every day | ORAL | Status: DC

## 2015-02-07 NOTE — Progress Notes (Signed)
[place] @ [facility] [phone]  Fax:(336) [place]: [birth year]  MR#: [id number]  WER#:[id number]  Patient Care Team: Ashok Norris, MD as PCP - General (Family Medicine) Seeplaputhur Robinette Haines, MD (General Surgery)  CHIEF COMPLAINT:  Chief Complaint  Patient presents with  . OTHER   1. Carcinoma of breast (left) diagnoses in August of 2012. T1B, N0, M0 tumor.  ER positive, PR positive, HER-2 receptor negative, 2. Adjuvant radiation therapy with MammoSite technique 3. MammoPrint is low risk. 4. Patient was started on Femara from September of 2012  INTERVAL HISTORY:   58 year old lady with a history of carcinoma of breast stage IB disease on letrozole tolerating treatment very well.  Taking calcium and vitamin D No bony pain no bony fracture.  Had a mammogram in August of 2012 reported to be negative. REVIEW OF SYSTEMS:   GENERAL:  Feels good.  Active.  No fevers, sweats or weight loss. PERFORMANCE STATUS (ECOG): 0 HEENT:  No visual changes, runny nose, sore throat, mouth sores or tenderness. Lungs: No shortness of breath or cough.  No hemoptysis. Cardiac:  No chest pain, palpitations, orthopnea, or PND. GI:  No nausea, vomiting, diarrhea, constipation, melena or hematochezia. GU:  No urgency, frequency, dysuria, or hematuria. Musculoskeletal:  No back pain.  No joint pain.  No muscle tenderness. Extremities:  No pain or swelling. Skin:  No rashes or skin changes. Neuro:  No headache, numbness or weakness, balance or coordination issues. Endocrine:  No diabetes, thyroid issues, hot flashes or night sweats. Psych:  No mood changes, depression or anxiety. Pain:  No focal pain. Review of systems:  All other systems reviewed and found to be negative. As per HPI. Otherwise, a complete review of systems is negatve.  PAST MEDICAL HISTORY: Past Medical History  Diagnosis Date  . 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 Diffuse cystic mastopathy     . Hypertension   . Thyroid disease 2010  . Personal history of malignant neoplasm of breast 2012    PAST SURGICAL HISTORY: Past Surgical History  Procedure Laterality Date  . Colonoscopy  2007    Dr. Allen Norris  . Tooth extraction  1970  . Wisdom teeth removal  2008  . Cesarean section  1985  . Breast surgery Left 2012    mammosite balloon placement  . Breast lumpectomy Left Aug 2012    ER/PR POS,HER 2 NEG    FAMILY HISTORY Family History  Problem Relation Age of Onset  . Heart attack Father   . Thyroid disease Mother     ADVANCED DIRECTIVES:  Patient does have advance healthcare directive, Patient   does not desire to make any changes HEALTH MAINTENANCE: Social History  Substance Use Topics  . Smoking status: Never Smoker   . Smokeless tobacco: Never Used  . Alcohol Use: No      Allergies  Allergen Reactions  . Ampicillin Anaphylaxis  . Vibramycin [Doxycycline Calcium] Anaphylaxis    Current Outpatient Prescriptions  Medication Sig Dispense Refill  . letrozole (FEMARA) 2.5 MG tablet Take 2.5 mg by mouth daily.     Marland Kitchen levothyroxine (SYNTHROID, LEVOTHROID) 50 MCG tablet TAKE 1 TABLET BY MOUTH EVERY DAY ON AN EMPTY STOMACH 30 tablet 5  . Vitamin D, Ergocalciferol, (DRISDOL) 50000 UNITS CAPS capsule every 7 (seven) days.      No current facility-administered medications for this visit.  Significant History/PMH:   h/o acid reflux:    h/o anemia:  previous blood transfusion:    obesity:    Left breast cancer: 2012   Hypothyroidism:    Colonoscopy:    breast biopsy:    dental surgery:    C-Section:   Preventive Screening:  Has patient had any of the following test? Colonscopy  Mammography  Pap Smear (1)   Last Colonoscopy: 2007(1)   Last Mammography: 12/2014   Last Pap Smear: June 2013(1)   Smoking History: Smoking History Never Smoked.(1)  PFSH: Comments: No family history of colorectal cancer, breast cancer, or ovarian cancer.  Comments:  does not smoke does not drink.  Did not take any estrogen therapy  Additional Past Medical and Surgical History: no significant past medical history    OBJECTIVE:  Filed Vitals:   02/07/15 1101  BP: 140/85  Pulse: 72  Temp: 97.2 F (36.2 C)     Body mass index is 34.72 kg/(m^2).    ECOG FS:0 - Asymptomatic  PHYSICAL EXAM: GENERAL:  Well developed, well nourished, sitting comfortably in the exam room in no acute distress. MENTAL STATUS:  Alert and oriented to person, place and time.  ENT:  Oropharynx clear without lesion.  Tongue normal. Mucous membranes moist.  RESPIRATORY:  Clear to auscultation without rales, wheezes or rhonchi. CARDIOVASCULAR:  Regular rate and rhythm without murmur, rub or gallop. BREAST:  Examination of both breasts by Dr. Jamal Collin in last 3 weeks ABDOMEN:  Soft, non-tender, with active bowel sounds, and no hepatosplenomegaly.  No masses. BACK:  No CVA tenderness.  No tenderness on percussion of the back or rib cage. SKIN:  No rashes, ulcers or lesions. EXTREMITIES: No edema, no skin discoloration or tenderness.  No palpable cords. LYMPH NODES: No palpable cervical, supraclavicular, axillary or inguinal adenopathy  NEUROLOGICAL: Unremarkable. PSYCH:  Appropriate.   LAB RESULTS:  CBC Latest Ref Rng 02/07/2015 12/16/2014  WBC 3.6 - 11.0 K/uL 5.9 6.6  Hemoglobin 12.0 - 16.0 g/dL 12.9 -  Hematocrit 35.0 - 47.0 % 38.6 38.6  Platelets 150 - 440 K/uL 226 -    Appointment on 02/07/2015  Component Date Value Ref Range Status  . WBC 02/07/2015 5.9  3.6 - 11.0 K/uL Final  . RBC 02/07/2015 4.29  3.80 - 5.20 MIL/uL Final  . Hemoglobin 02/07/2015 12.9  12.0 - 16.0 g/dL Final  . HCT 02/07/2015 38.6  35.0 - 47.0 % Final  . MCV 02/07/2015 90.0  80.0 - 100.0 fL Final  . MCH 02/07/2015 30.0  26.0 - 34.0 pg Final  . MCHC 02/07/2015 33.4  32.0 - 36.0 g/dL Final  . RDW 02/07/2015 13.1  11.5 - 14.5 % Final  . Platelets 02/07/2015 226  150 - 440 K/uL Final  . Neutrophils  Relative % 02/07/2015 62   Final  . Neutro Abs 02/07/2015 3.7  1.4 - 6.5 K/uL Final  . Lymphocytes Relative 02/07/2015 26   Final  . Lymphs Abs 02/07/2015 1.5  1.0 - 3.6 K/uL Final  . Monocytes Relative 02/07/2015 8   Final  . Monocytes Absolute 02/07/2015 0.5  0.2 - 0.9 K/uL Final  . Eosinophils Relative 02/07/2015 3   Final  . Eosinophils Absolute 02/07/2015 0.2  0 - 0.7 K/uL Final  . Basophils Relative 02/07/2015 1   Final  . Basophils Absolute 02/07/2015 0.1  0 - 0.1 K/uL Final  . Sodium 02/07/2015 138  135 - 145 mmol/L Final  . Potassium 02/07/2015 4.0  3.5 - 5.1 mmol/L Final  . Chloride 02/07/2015 107  101 - 111 mmol/L Final  .  CO2 02/07/2015 26  22 - 32 mmol/L Final  . Glucose, Bld 02/07/2015 102* 65 - 99 mg/dL Final  . BUN 02/07/2015 16  6 - 20 mg/dL Final  . Creatinine, Ser 02/07/2015 0.73  0.44 - 1.00 mg/dL Final  . Calcium 02/07/2015 8.7* 8.9 - 10.3 mg/dL Final  . Total Protein 02/07/2015 7.3  6.5 - 8.1 g/dL Final  . Albumin 02/07/2015 4.0  3.5 - 5.0 g/dL Final  . AST 02/07/2015 19  15 - 41 U/L Final  . ALT 02/07/2015 16  14 - 54 U/L Final  . Alkaline Phosphatase 02/07/2015 113  38 - 126 U/L Final  . Total Bilirubin 02/07/2015 0.5  0.3 - 1.2 mg/dL Final  . GFR calc non Af Amer 02/07/2015 >60  >60 mL/min Final  . GFR calc Af Amer 02/07/2015 >60  >60 mL/min Final   Comment: (NOTE) The eGFR has been calculated using the CKD EPI equation. This calculation has not been validated in all clinical situations. eGFR's persistently <60 mL/min signify possible Chronic Kidney Disease.   . Anion gap 02/07/2015 5  5 - 15 Final       STUDIES:  ASSESSMENT: Carcinoma of breast there is no evidence of recurrent or progressive disease all lab data has been reviewed  MEDICAL DECISION MAKING:  Mammogram in August of 2016 within normal limit Continue letrozole.  Continue calcium and vitamin D.  Consider bone density study during next visit.  Patient expressed understanding and was  in agreement with this plan. She also understands that She can call clinic at any time with any questions, concerns, or complaints.    No matching staging information was found for the patient.  Forest Gleason, MD   02/07/2015 11:34 AM

## 2015-02-07 NOTE — Progress Notes (Signed)
Patient does not have living will.  Former smoker. 

## 2015-02-25 ENCOUNTER — Encounter: Payer: Self-pay | Admitting: Oncology

## 2015-04-07 ENCOUNTER — Encounter: Payer: Self-pay | Admitting: General Surgery

## 2015-04-12 ENCOUNTER — Encounter: Payer: Self-pay | Admitting: General Surgery

## 2015-06-13 ENCOUNTER — Encounter: Payer: Self-pay | Admitting: Family Medicine

## 2015-06-13 ENCOUNTER — Ambulatory Visit (INDEPENDENT_AMBULATORY_CARE_PROVIDER_SITE_OTHER): Payer: BC Managed Care – PPO | Admitting: Family Medicine

## 2015-06-13 VITALS — BP 122/80 | HR 88 | Temp 98.9°F | Resp 14 | Ht 64.0 in | Wt 202.8 lb

## 2015-06-13 DIAGNOSIS — E039 Hypothyroidism, unspecified: Secondary | ICD-10-CM

## 2015-06-13 DIAGNOSIS — E669 Obesity, unspecified: Secondary | ICD-10-CM

## 2015-06-13 DIAGNOSIS — R739 Hyperglycemia, unspecified: Secondary | ICD-10-CM | POA: Insufficient documentation

## 2015-06-13 DIAGNOSIS — J0191 Acute recurrent sinusitis, unspecified: Secondary | ICD-10-CM | POA: Diagnosis not present

## 2015-06-13 LAB — GLUCOSE, POCT (MANUAL RESULT ENTRY): POC GLUCOSE: 94 mg/dL (ref 70–99)

## 2015-06-13 LAB — POCT GLYCOSYLATED HEMOGLOBIN (HGB A1C): Hemoglobin A1C: 6

## 2015-06-13 MED ORDER — FLUTICASONE PROPIONATE 50 MCG/ACT NA SUSP: 2.0000 | Freq: Every day | NASAL | Status: DC

## 2015-06-13 MED ORDER — DEXTROMETHORPHAN-GUAIFENESIN 5-100 MG/5ML PO LIQD: 2.0000 | Freq: Two times a day (BID) | ORAL | Status: DC

## 2015-06-13 MED ORDER — AZITHROMYCIN 250 MG PO TABS
ORAL_TABLET | ORAL | Status: DC
Start: 2015-06-13 — End: 2015-08-12

## 2015-06-13 NOTE — Progress Notes (Signed)
Name: Deanna Holder   MRN: [id number]    DOB: 1956/03/19   Date:06/13/2015       Progress Note  Subjective  Chief Complaint  Chief Complaint  Patient presents with  . Hypothyroidism    6 month follow up  . URI    cough, congestion low grade fever for 3 weeks    HPI  Hypothyroidism   Patient presents for follow-up of hypothyroidism. It has been present for over 5 years.  Current symptoms consist of fatigue and weight gain . Current medication regimen consist of levothyroxin 50 micrograms daily .   There is good compliance with regimen.    Sinusitis  Patient presents with greater than 7 day history of nasal congestion and drainage which is purulent in color. There is tenderness over the sinuses. There has been fever to 99.7 along with some associated chills on occasion. Usage of over-the-counter medications is not been affected. There is also accompanying cough productive of purulent sputum.  Bronchitis  Patient presents with a greater than 1 week history of cough productive of purulent sputum. The cough is irritating and keep the patient awake at night. There has no fever or chills.  Over-the-counter meds And completely effective.  Hyperglycemia  Patient has a history of elevated glucose in the past. She also has a history of obesity. She is not following a regular diet or exercise regimen. There is no definite history of diabetes.      Past Medical History  Diagnosis Date  . Cancer [facility]) 2012    left breast  . Diffuse cystic mastopathy   . Hypertension   . Thyroid disease 2010  . Personal history of malignant neoplasm of breast 2012    Social History  Substance Use Topics  . Smoking status: Never Smoker   . Smokeless tobacco: Never Used  . Alcohol Use: No     Current outpatient prescriptions:  .  letrozole (FEMARA) 2.5 MG tablet, Take 1 tablet (2.5 mg total) by mouth daily., Disp: 30 tablet, Rfl: 6 .  levothyroxine (SYNTHROID, LEVOTHROID) 50 MCG tablet,  TAKE 1 TABLET BY MOUTH EVERY DAY ON AN EMPTY STOMACH, Disp: 30 tablet, Rfl: 5 .  Vitamin D, Ergocalciferol, (DRISDOL) 50000 UNITS CAPS capsule, every 7 (seven) days. , Disp: , Rfl:   Allergies  Allergen Reactions  . Ampicillin Anaphylaxis  . Vibramycin [Doxycycline Calcium] Anaphylaxis    Review of Systems  Constitutional: Positive for fever and chills. Negative for weight loss.  HENT: Positive for congestion. Negative for hearing loss, sore throat and tinnitus.   Eyes: Negative for blurred vision, double vision and redness.  Respiratory: Positive for cough and sputum production. Negative for hemoptysis and shortness of breath.   Cardiovascular: Negative for chest pain, palpitations, orthopnea, claudication and leg swelling.  Gastrointestinal: Negative for heartburn, nausea, vomiting, diarrhea, constipation and blood in stool.  Genitourinary: Negative for dysuria, urgency, frequency and hematuria.  Musculoskeletal: Negative for myalgias, back pain, joint pain, falls and neck pain.  Skin: Negative for itching.  Neurological: Negative for dizziness, tingling, tremors, focal weakness, seizures, loss of consciousness, weakness and headaches.  Endo/Heme/Allergies: Does not bruise/bleed easily.  Psychiatric/Behavioral: Negative for depression and substance abuse. The patient is not nervous/anxious and does not have insomnia.      Objective  Filed Vitals:   06/13/15 1009  BP: 122/80  Pulse: 88  Temp: 98.9 F (37.2 C)  TempSrc: Oral  Resp: 14  Height: 5\' 4"  (1.626 m)  Weight: 202 lb 12.8 oz (  91.989 kg)  SpO2: 95%     Physical Exam  Constitutional: She is oriented to person, place, and time.  Obesity no acute distress  HENT:  Head: Normocephalic.  Mr. Drucilla Schmidt is following with mucopurulent discharge pharynx minimally injected  Eyes: EOM are normal. Pupils are equal, round, and reactive to light.  Neck: Normal range of motion. No thyromegaly present.  Cardiovascular: Normal rate,  regular rhythm and normal heart sounds.   No murmur heard. Pulmonary/Chest: Effort normal and breath sounds normal.  Abdominal: Soft. Bowel sounds are normal.  Musculoskeletal: Normal range of motion. She exhibits no edema.  Neurological: She is alert and oriented to person, place, and time. No cranial nerve deficit. Gait normal.  Skin: Skin is warm and dry. No rash noted.  Psychiatric: Memory and affect normal.      Assessment & Plan  1. Hyperglycemia Encouraged to be on a high-protein low-carb diet - POCT HgB A1C - POCT Glucose (CBG)  2. Acute recurrent sinusitis, unspecified location As below - azithromycin (ZITHROMAX) 250 MG tablet; 1 pack as directed  Dispense: 6 tablet; Refill: 0 - fluticasone (FLONASE) 50 MCG/ACT nasal spray; Place 2 sprays into both nostrils daily.  Dispense: 16 g; Refill: 6 - Dextromethorphan-Guaifenesin (DELSYM [facility]/CHEST CONG DM CHILD) 5-100 MG/5ML LIQD; Take 2 Doses by mouth 2 (two) times daily.  Dispense: 1 Bottle; Refill: 0  3. Hypothyroidism, unspecified hypothyroidism type TSH today - TSH  4. Obesity Diet sheet given

## 2015-06-25 ENCOUNTER — Other Ambulatory Visit: Payer: Self-pay | Admitting: Family Medicine

## 2015-07-08 LAB — TSH: TSH: 2.02 u[IU]/mL (ref 0.450–4.500)

## 2015-07-19 ENCOUNTER — Other Ambulatory Visit: Payer: Self-pay | Admitting: Family Medicine

## 2015-08-09 ENCOUNTER — Ambulatory Visit: Payer: BC Managed Care – PPO | Admitting: Oncology

## 2015-08-09 ENCOUNTER — Other Ambulatory Visit: Payer: BC Managed Care – PPO

## 2015-08-12 ENCOUNTER — Encounter: Payer: Self-pay | Admitting: Oncology

## 2015-08-12 ENCOUNTER — Inpatient Hospital Stay (HOSPITAL_BASED_OUTPATIENT_CLINIC_OR_DEPARTMENT_OTHER): Payer: BC Managed Care – PPO | Admitting: Oncology

## 2015-08-12 ENCOUNTER — Inpatient Hospital Stay: Payer: BC Managed Care – PPO | Attending: Oncology

## 2015-08-12 VITALS — BP 130/84 | HR 56 | Temp 97.3°F | Resp 18 | Wt 186.2 lb

## 2015-08-12 DIAGNOSIS — K219 Gastro-esophageal reflux disease without esophagitis: Secondary | ICD-10-CM | POA: Diagnosis not present

## 2015-08-12 DIAGNOSIS — Z853 Personal history of malignant neoplasm of breast: Secondary | ICD-10-CM

## 2015-08-12 DIAGNOSIS — Z170 Estrogen receptor positive status [ER+]: Secondary | ICD-10-CM

## 2015-08-12 DIAGNOSIS — Z79899 Other long term (current) drug therapy: Secondary | ICD-10-CM | POA: Insufficient documentation

## 2015-08-12 DIAGNOSIS — Z79811 Long term (current) use of aromatase inhibitors: Secondary | ICD-10-CM | POA: Insufficient documentation

## 2015-08-12 DIAGNOSIS — E039 Hypothyroidism, unspecified: Secondary | ICD-10-CM | POA: Diagnosis not present

## 2015-08-12 DIAGNOSIS — I10 Essential (primary) hypertension: Secondary | ICD-10-CM | POA: Insufficient documentation

## 2015-08-12 DIAGNOSIS — Z923 Personal history of irradiation: Secondary | ICD-10-CM

## 2015-08-12 DIAGNOSIS — C50212 Malignant neoplasm of upper-inner quadrant of left female breast: Secondary | ICD-10-CM | POA: Diagnosis present

## 2015-08-12 LAB — CBC WITH DIFFERENTIAL/PLATELET
BASOS PCT: 1 %
Basophils Absolute: 0 10*3/uL (ref 0–0.1)
EOS ABS: 0.1 10*3/uL (ref 0–0.7)
Eosinophils Relative: 2 %
HCT: 39 % (ref 35.0–47.0)
HEMOGLOBIN: 13.3 g/dL (ref 12.0–16.0)
LYMPHS ABS: 1.6 10*3/uL (ref 1.0–3.6)
Lymphocytes Relative: 28 %
MCH: 30.5 pg (ref 26.0–34.0)
MCHC: 34 g/dL (ref 32.0–36.0)
MCV: 89.5 fL (ref 80.0–100.0)
Monocytes Absolute: 0.4 10*3/uL (ref 0.2–0.9)
Monocytes Relative: 8 %
Neutro Abs: 3.6 10*3/uL (ref 1.4–6.5)
Neutrophils Relative %: 61 %
Platelets: 217 10*3/uL (ref 150–440)
RBC: 4.35 MIL/uL (ref 3.80–5.20)
RDW: 13 % (ref 11.5–14.5)
WBC: 5.8 10*3/uL (ref 3.6–11.0)

## 2015-08-12 LAB — COMPREHENSIVE METABOLIC PANEL
ALBUMIN: 4.5 g/dL (ref 3.5–5.0)
ALK PHOS: 102 U/L (ref 38–126)
ALT: 19 U/L (ref 14–54)
ANION GAP: 5 (ref 5–15)
AST: 18 U/L (ref 15–41)
BUN: 17 mg/dL (ref 6–20)
CHLORIDE: 104 mmol/L (ref 101–111)
CO2: 28 mmol/L (ref 22–32)
CREATININE: 0.82 mg/dL (ref 0.44–1.00)
Calcium: 9.4 mg/dL (ref 8.9–10.3)
GFR calc non Af Amer: >60 mL/min (ref >60)
Glucose, Bld: 104 mg/dL — ABNORMAL HIGH (ref 65–99)
POTASSIUM: 4.1 mmol/L (ref 3.5–5.1)
SODIUM: 137 mmol/L (ref 135–145)
Total Bilirubin: 0.6 mg/dL (ref 0.3–1.2)
Total Protein: 7.7 g/dL (ref 6.5–8.1)

## 2015-08-12 NOTE — Progress Notes (Signed)
[place] @ [facility] Telephone:(336) [phone]  Fax:(336) [place]: July 28, 1955  MR#: [id number]  HER#:[id number]  Patient Care Team: Ashok Norris, MD as PCP - General (Family Medicine) Seeplaputhur Robinette Haines, MD (General Surgery)  CHIEF COMPLAINT:  Chief Complaint  Patient presents with  . Breast Cancer   1. Carcinoma of breast (left) diagnoses in August of 2012. T1B, N0, M0 tumor.  ER positive, PR positive, HER-2 receptor negative, 2. Adjuvant radiation therapy with MammoSite technique 3. MammoPrint is low risk. 4. Patient was started on Femara from September of 2012 5.BCR I index shows 2.6% risk of late recurrence and low likelihood of benefit from continuing ANTI-hormone treatment beyond 5 years.  6.  Patient will finish 5 years of anti-hormone therapy by July of 2017 INTERVAL HISTORY:   60 year old lady with a history of carcinoma of breast stage IB disease on letrozole tolerating treatment very well.  Taking calcium and vitamin D No bony pain no bony fracture.  Had a mammogram in August of 2012 reported to be negative. As not have any significant problem.  Mammogram was done in August 2017 REVIEW OF SYSTEMS:   GENERAL:  Feels good.  Active.  No fevers, sweats or weight loss. PERFORMANCE STATUS (ECOG): 0 HEENT:  No visual changes, runny nose, sore throat, mouth sores or tenderness. Lungs: No shortness of breath or cough.  No hemoptysis. Cardiac:  No chest pain, palpitations, orthopnea, or PND. GI:  No nausea, vomiting, diarrhea, constipation, melena or hematochezia. GU:  No urgency, frequency, dysuria, or hematuria. Musculoskeletal:  No back pain.  No joint pain.  No muscle tenderness. Extremities:  No pain or swelling. Skin:  No rashes or skin changes. Neuro:  No headache, numbness or weakness, balance or coordination issues. Endocrine:  No diabetes, thyroid issues, hot flashes or night sweats. Psych:  No mood changes, depression or  anxiety. Pain:  No focal pain. Review of systems:  All other systems reviewed and found to be negative. As per HPI. Otherwise, a complete review of systems is negatve.  PAST MEDICAL HISTORY: Past Medical History  Diagnosis Date  . Cancer [facility]) 2012    left breast  . Diffuse cystic mastopathy   . Hypertension   . Thyroid disease 2010  . Personal history of malignant neoplasm of breast 2012    PAST SURGICAL HISTORY: Past Surgical History  Procedure Laterality Date  . Colonoscopy  2007    Dr. Allen Norris  . Tooth extraction  1970  . Wisdom teeth removal  2008  . Cesarean section  1985  . Breast surgery Left 2012    mammosite balloon placement  . Breast lumpectomy Left Aug 2012    ER/PR POS,HER 2 NEG    FAMILY HISTORY Family History  Problem Relation Age of Onset  . Heart attack Father   . Thyroid disease Mother     ADVANCED DIRECTIVES:  Patient does have advance healthcare directive, Patient   does not desire to make any changes HEALTH MAINTENANCE: Social History  Substance Use Topics  . Smoking status: Never Smoker   . Smokeless tobacco: Never Used  . Alcohol Use: No      Allergies  Allergen Reactions  . Ampicillin Anaphylaxis  . Vibramycin [Doxycycline Calcium] Anaphylaxis    Current Outpatient Prescriptions  Medication Sig Dispense Refill  . fluticasone (FLONASE) 50 MCG/ACT nasal spray Place 2 sprays into both nostrils daily. 16 g 6  . letrozole (FEMARA) 2.5 MG tablet Take 1 tablet (2.5  mg total) by mouth daily. 30 tablet 6  . levothyroxine (SYNTHROID, LEVOTHROID) 50 MCG tablet TAKE 1 TABLET BY MOUTH EVERY DAY ON AN EMPTY STOMACH 30 tablet 2  . Vitamin D, Ergocalciferol, (DRISDOL) 50000 UNITS CAPS capsule every 7 (seven) days.      No current facility-administered medications for this visit.  Significant History/PMH:   h/o acid reflux:    h/o anemia:    previous blood transfusion:    obesity:    Left breast cancer: 2012   Hypothyroidism:     Colonoscopy:    breast biopsy:    dental surgery:    C-Section:   Preventive Screening:  Has patient had any of the following test? Colonscopy  Mammography  Pap Smear (1)   Last Colonoscopy: 2007(1)   Last Mammography: 12/2014   Last Pap Smear: June 2013(1)   Smoking History: Smoking History Never Smoked.(1)  PFSH: Comments: No family history of colorectal cancer, breast cancer, or ovarian cancer.  Comments: does not smoke does not drink.  Did not take any estrogen therapy  Additional Past Medical and Surgical History: no significant past medical history    OBJECTIVE:  Filed Vitals:   08/12/15 1140  BP: 130/84  Pulse: 56  Temp: 97.3 F (36.3 C)  Resp: 18     Body mass index is 31.95 kg/(m^2).    ECOG FS:0 - Asymptomatic  PHYSICAL EXAM: GENERAL:  Well developed, well nourished, sitting comfortably in the exam room in no acute distress. MENTAL STATUS:  Alert and oriented to person, place and time.  ENT:  Oropharynx clear without lesion.  Tongue normal. Mucous membranes moist.  RESPIRATORY:  Clear to auscultation without rales, wheezes or rhonchi. CARDIOVASCULAR:  Regular rate and rhythm without murmur, rub or gallop. BREAST:  Examination of both breasts by Dr. Jamal Collin in last 3 weeks ABDOMEN:  Soft, non-tender, with active bowel sounds, and no hepatosplenomegaly.  No masses. BACK:  No CVA tenderness.  No tenderness on percussion of the back or rib cage. SKIN:  No rashes, ulcers or lesions. EXTREMITIES: No edema, no skin discoloration or tenderness.  No palpable cords. LYMPH NODES: No palpable cervical, supraclavicular, axillary or inguinal adenopathy  NEUROLOGICAL: Unremarkable. PSYCH:  Appropriate.   LAB RESULTS:  CBC Latest Ref Rng 08/12/2015 02/07/2015  WBC 3.6 - 11.0 K/uL 5.8 5.9  Hemoglobin 12.0 - 16.0 g/dL 13.3 12.9  Hematocrit 35.0 - 47.0 % 39.0 38.6  Platelets 150 - 440 K/uL 217 226    Appointment on 08/12/2015  Component Date Value Ref Range Status   . WBC 08/12/2015 5.8  3.6 - 11.0 K/uL Final  . RBC 08/12/2015 4.35  3.80 - 5.20 MIL/uL Final  . Hemoglobin 08/12/2015 13.3  12.0 - 16.0 g/dL Final  . HCT 08/12/2015 39.0  35.0 - 47.0 % Final  . MCV 08/12/2015 89.5  80.0 - 100.0 fL Final  . MCH 08/12/2015 30.5  26.0 - 34.0 pg Final  . MCHC 08/12/2015 34.0  32.0 - 36.0 g/dL Final  . RDW 08/12/2015 13.0  11.5 - 14.5 % Final  . Platelets 08/12/2015 217  150 - 440 K/uL Final  . Neutrophils Relative % 08/12/2015 61   Final  . Neutro Abs 08/12/2015 3.6  1.4 - 6.5 K/uL Final  . Lymphocytes Relative 08/12/2015 28   Final  . Lymphs Abs 08/12/2015 1.6  1.0 - 3.6 K/uL Final  . Monocytes Relative 08/12/2015 8   Final  . Monocytes Absolute 08/12/2015 0.4  0.2 - 0.9 K/uL Final  .  Eosinophils Relative 08/12/2015 2   Final  . Eosinophils Absolute 08/12/2015 0.1  0 - 0.7 K/uL Final  . Basophils Relative 08/12/2015 1   Final  . Basophils Absolute 08/12/2015 0.0  0 - 0.1 K/uL Final  . Sodium 08/12/2015 137  135 - 145 mmol/L Final  . Potassium 08/12/2015 4.1  3.5 - 5.1 mmol/L Final  . Chloride 08/12/2015 104  101 - 111 mmol/L Final  . CO2 08/12/2015 28  22 - 32 mmol/L Final  . Glucose, Bld 08/12/2015 104* 65 - 99 mg/dL Final  . BUN 08/12/2015 17  6 - 20 mg/dL Final  . Creatinine, Ser 08/12/2015 0.82  0.44 - 1.00 mg/dL Final  . Calcium 08/12/2015 9.4  8.9 - 10.3 mg/dL Final  . Total Protein 08/12/2015 7.7  6.5 - 8.1 g/dL Final  . Albumin 08/12/2015 4.5  3.5 - 5.0 g/dL Final  . AST 08/12/2015 18  15 - 41 U/L Final  . ALT 08/12/2015 19  14 - 54 U/L Final  . Alkaline Phosphatase 08/12/2015 102  38 - 126 U/L Final  . Total Bilirubin 08/12/2015 0.6  0.3 - 1.2 mg/dL Final  . GFR calc non Af Amer 08/12/2015 >60  >60 mL/min Final  . GFR calc Af Amer 08/12/2015 >60  >60 mL/min Final   Comment: (NOTE) The eGFR has been calculated using the CKD EPI equation. This calculation has not been validated in all clinical situations. eGFR's persistently <60 mL/min  signify possible Chronic Kidney Disease.   . Anion gap 08/12/2015 5  5 - 15 Final       STUDIES:  ASSESSMENT: Carcinoma of breast there is no evidence of recurrent or progressive disease all lab data has been reviewed  MEDICAL DECISION MAKING:  Mammogram in August of 2016 within normal limit Continue letrozole.  Continue calcium and vitamin D.  Consider bone density study during next visit.  Breast cancer index was 2.6% chance of recurrent disease and low likelihood of the feet from anti-hormonal therapy.  The patient was advised to stop treatment in now July of 2017 and then will be followed on regular basis by primary care physician as well as by Dr. Jamal Collin . Patient has been discharged from my care [place]  guidelines for follow-up in breast cancer patient History and physical 1-4 times per year as clinically indicated for first 5 years Periodic screening for changes in the family history and referable to genetic counseling is necessary. Mammographic every 12 months Educated monitor and refer patient for lymphedema management if needed Routine imaging of reconstructed breasts is not indicated In absence of clinical signs and symptoms suggestive of recurrent disease there is no indication for lab or imaging studies for metastatic screening Recommend on tamoxifen needs annual GYN evaluation if uterus is present.  Patient on aromatase inhibitor needs bone density study for evaluation regarding osteoporosis Evidence suggests that active lifestyle healthy diet limited alcohol intake and achieving i and maintaining ideal body weight may lead to optimal breast cancer outcome  Patient expressed understanding and was in agreement with this plan. She also understands that She can call clinic at any time with any questions, concerns, or complaints.    No matching staging information was found for the patient.  Forest Gleason, MD   08/12/2015 11:53 AM

## 2015-08-25 ENCOUNTER — Other Ambulatory Visit: Payer: Self-pay | Admitting: Oncology

## 2015-09-05 ENCOUNTER — Other Ambulatory Visit: Payer: Self-pay | Admitting: Oncology

## 2015-09-19 ENCOUNTER — Ambulatory Visit: Payer: BC Managed Care – PPO | Admitting: Family Medicine

## 2015-11-10 ENCOUNTER — Other Ambulatory Visit: Payer: Self-pay | Admitting: Family Medicine

## 2015-11-23 ENCOUNTER — Other Ambulatory Visit: Payer: Self-pay | Admitting: Family Medicine

## 2015-12-05 ENCOUNTER — Other Ambulatory Visit: Payer: Self-pay | Admitting: *Deleted

## 2015-12-05 NOTE — Telephone Encounter (Signed)
No longer under [place] care

## 2015-12-06 ENCOUNTER — Encounter: Payer: Self-pay | Admitting: Family Medicine

## 2015-12-06 ENCOUNTER — Ambulatory Visit (INDEPENDENT_AMBULATORY_CARE_PROVIDER_SITE_OTHER): Payer: BC Managed Care – PPO | Admitting: Family Medicine

## 2015-12-06 VITALS — BP 132/84 | HR 62 | Temp 98.1°F | Resp 18 | Ht 64.0 in | Wt 184.0 lb

## 2015-12-06 DIAGNOSIS — Z1211 Encounter for screening for malignant neoplasm of colon: Secondary | ICD-10-CM | POA: Diagnosis not present

## 2015-12-06 DIAGNOSIS — E039 Hypothyroidism, unspecified: Secondary | ICD-10-CM

## 2015-12-06 DIAGNOSIS — Z79899 Other long term (current) drug therapy: Secondary | ICD-10-CM | POA: Diagnosis not present

## 2015-12-06 DIAGNOSIS — E668 Other obesity: Secondary | ICD-10-CM

## 2015-12-06 DIAGNOSIS — R739 Hyperglycemia, unspecified: Secondary | ICD-10-CM

## 2015-12-06 DIAGNOSIS — E785 Hyperlipidemia, unspecified: Secondary | ICD-10-CM | POA: Diagnosis not present

## 2015-12-06 DIAGNOSIS — IMO0002 Reserved for concepts with insufficient information to code with codable children: Secondary | ICD-10-CM

## 2015-12-06 DIAGNOSIS — Z853 Personal history of malignant neoplasm of breast: Secondary | ICD-10-CM

## 2015-12-06 LAB — CBC WITH DIFFERENTIAL/PLATELET
BASOS PCT: 0 %
Basophils Absolute: 0 cells/uL (ref 0–200)
EOS PCT: 5 %
Eosinophils Absolute: 305 cells/uL (ref 15–500)
HCT: 40.5 % (ref 35.0–45.0)
Hemoglobin: 13.3 g/dL (ref 11.7–15.5)
LYMPHS ABS: 1830 {cells}/uL (ref 850–3900)
LYMPHS PCT: 30 %
MCH: 29.9 pg (ref 27.0–33.0)
MCHC: 32.8 g/dL (ref 32.0–36.0)
MCV: 91 fL (ref 80.0–100.0)
MONO ABS: 366 {cells}/uL (ref 200–950)
MPV: 9.9 fL (ref 7.5–12.5)
Monocytes Relative: 6 %
NEUTROS PCT: 59 %
Neutro Abs: 3599 cells/uL (ref 1500–7800)
Platelets: 263 10*3/uL (ref 140–400)
RBC: 4.45 MIL/uL (ref 3.80–5.10)
RDW: 13.1 % (ref 11.0–15.0)
WBC: 6.1 10*3/uL (ref 3.8–10.8)

## 2015-12-06 LAB — LIPID PANEL
Cholesterol: 213 mg/dL — ABNORMAL HIGH (ref 125–200)
HDL: 55 mg/dL (ref >=46)
LDL CALC: 137 mg/dL — AB (ref <130)
TRIGLYCERIDES: 107 mg/dL (ref <150)
Total CHOL/HDL Ratio: 3.9 Ratio (ref <=5.0)
VLDL: 21 mg/dL (ref <30)

## 2015-12-06 LAB — COMPLETE METABOLIC PANEL WITH GFR
ALBUMIN: 4.2 g/dL (ref 3.6–5.1)
ALK PHOS: 106 U/L (ref 33–130)
ALT: 12 U/L (ref 6–29)
AST: 14 U/L (ref 10–35)
BILIRUBIN TOTAL: 0.6 mg/dL (ref 0.2–1.2)
BUN: 12 mg/dL (ref 7–25)
CALCIUM: 9.6 mg/dL (ref 8.6–10.4)
CO2: 29 mmol/L (ref 20–31)
CREATININE: 0.97 mg/dL (ref 0.50–0.99)
Chloride: 103 mmol/L (ref 98–110)
GFR, Est African American: 73 mL/min (ref >=60)
GFR, Est Non African American: 64 mL/min (ref >=60)
Glucose, Bld: 100 mg/dL — ABNORMAL HIGH (ref 65–99)
Potassium: 4.5 mmol/L (ref 3.5–5.3)
Sodium: 142 mmol/L (ref 135–146)
TOTAL PROTEIN: 6.8 g/dL (ref 6.1–8.1)

## 2015-12-06 LAB — TSH: TSH: 2.68 m[IU]/L

## 2015-12-06 NOTE — Progress Notes (Signed)
Name: Deanna Holder   MRN: [id number]    DOB: 11-21-1955   Date:12/06/2015       Progress Note  Subjective  Chief Complaint  Chief Complaint  Patient presents with  . Follow-up    patient had an appt to f/u with Dr. Rutherford Nail for prediabetes and thyroid  . Medication Refill  . Labs Only    patient is fasting in case she needed labs  . Immunizations    HPI  History of breast cancer: diagnosed in 2012 and still follows up with Dr. Jamal Collin, released from Dr. Jeb Levering this month, finishing up the last bottle of Femara. She denies side effects of medication  Hypothyroidism: she was diagnosed with hypothyroidism around 2010, taking levothyroxine 50 mcg daily, she denies dry skin, no constipation, no fatigue  Obesity: losing weight since January 2017. Starting weight was 202 lbs, and is down to 184 lbs. She has changed her diet, eating more fruit and vegetables, lean meat, avoiding fried food, and cutting down on carbohydrates. Drinking water instead of sweet tea.   Hyperglycemia: she denies polyphagia, polydipsia or polyuria. Losing weight.    Patient Active Problem List   Diagnosis Date Noted  . Hyperglycemia 06/13/2015  . Hypothyroidism (acquired) 06/13/2015  . Adult BMI 30+ 02/07/2015  . H/O malignant neoplasm of breast 02/07/2015  . H/O malignant neoplasm 02/07/2015  . Menopause 02/07/2015  . History of breast cancer 01/13/2013  . Allergic rhinitis 10/22/2006    Past Surgical History  Procedure Laterality Date  . Colonoscopy  2007    Dr. Allen Norris  . Tooth extraction  1970  . Wisdom teeth removal  2008  . Cesarean section  1985  . Breast surgery Left 2012    mammosite balloon placement  . Breast lumpectomy Left Aug 2012    ER/PR POS,HER 2 NEG    Family History  Problem Relation Age of Onset  . Heart attack Father   . Thyroid disease Mother     Social History   Social History  . Marital Status: Divorced    Spouse Name: N/A  . Number of Children: N/A  . Years  of Education: N/A   Occupational History  . Not on file.   Social History Main Topics  . Smoking status: Never Smoker   . Smokeless tobacco: Never Used  . Alcohol Use: No  . Drug Use: No  . Sexual Activity: Not on file   Other Topics Concern  . Not on file   Social History Narrative     Current outpatient prescriptions:  .  fluticasone (FLONASE) 50 MCG/ACT nasal spray, Place 2 sprays into both nostrils daily., Disp: 16 g, Rfl: 6 .  levothyroxine (SYNTHROID, LEVOTHROID) 50 MCG tablet, TAKE 1 TABLET BY MOUTH EVERY DAY ON AN EMPTY STOMACH, Disp: 30 tablet, Rfl: 2 .  letrozole (FEMARA) 2.5 MG tablet, TAKE 1 TABLET EVERY DAY (Patient not taking: Reported on 12/06/2015), Disp: 90 tablet, Rfl: 0 .  Vitamin D, Ergocalciferol, (DRISDOL) 50000 UNITS CAPS capsule, every 7 (seven) days. Reported on 12/06/2015, Disp: , Rfl:   Allergies  Allergen Reactions  . Ampicillin Anaphylaxis  . Vibramycin [Doxycycline Calcium] Anaphylaxis     ROS  Constitutional: Negative for fever , positive for weight change.  Respiratory: Negative for cough and shortness of breath.   Cardiovascular: Negative for chest pain or palpitations.  Gastrointestinal: Negative for abdominal pain, no bowel changes.  Musculoskeletal: Negative for gait problem or joint swelling.  Skin: Negative for rash.  Neurological: Negative  for dizziness or headache.  No other specific complaints in a complete review of systems (except as listed in HPI above).  Objective  Filed Vitals:   12/06/15 0926  BP: 132/84  Pulse: 62  Temp: 98.1 F (36.7 C)  TempSrc: Oral  Resp: 18  Height: 5\' 4"  (1.626 m)  Weight: 184 lb (83.462 kg)  SpO2: 97%    Body mass index is 31.57 kg/(m^2).  Physical Exam  Constitutional: Patient appears well-developed and well-nourished. Obese No distress.  HEENT: head atraumatic, normocephalic, pupils equal and reactive to light,  neck supple, normal thyroid, throat within normal  limits Cardiovascular: Normal rate, regular rhythm and normal heart sounds.  No murmur heard. No BLE edema. Pulmonary/Chest: Effort normal and breath sounds normal. No respiratory distress. Abdominal: Soft.  There is no tenderness. Psychiatric: Patient has a normal mood and affect. behavior is normal. Judgment and thought content normal.   PHQ2/9: Depression screen [facility] 2/9 12/06/2015 06/13/2015 12/07/2014  Decreased Interest 0 0 0  Down, Depressed, Hopeless 0 0 0  PHQ - 2 Score 0 0 0     Fall Risk: Fall Risk  12/06/2015 06/13/2015 12/07/2014  Falls in the past year? No No No     Functional Status Survey: Is the patient deaf or have difficulty hearing?: No Does the patient have difficulty seeing, even when wearing glasses/contacts?: No (patient stated that she wears reading glasses) Does the patient have difficulty concentrating, remembering, or making decisions?: No Does the patient have difficulty walking or climbing stairs?: No Does the patient have difficulty dressing or bathing?: No Does the patient have difficulty doing errands alone such as visiting a doctor's office or shopping?: No    Assessment & Plan  1. Hyperglycemia  - Hemoglobin A1c  2. Hypothyroidism, unspecified hypothyroidism type  - TSH  3. Adult BMI 30+  Discussed with the patient the risk posed by an increased BMI. Discussed importance of portion control, calorie counting and at least 150 minutes of physical activity weekly. Avoid sweet beverages and drink more water. Eat at least 6 servings of fruit and vegetables daily   4. History of breast cancer  Finishing Femara this month  5. Dyslipidemia  - Lipid panel  6. Long-term use of high-risk medication  - COMPLETE METABOLIC PANEL WITH GFR - CBC with Differential/Platelet  7. Colon cancer screening  - Ambulatory referral to Gastroenterology

## 2015-12-07 LAB — HEMOGLOBIN A1C
Hgb A1c MFr Bld: 6 % — ABNORMAL HIGH (ref <5.7)
Mean Plasma Glucose: 126 mg/dL

## 2016-01-06 ENCOUNTER — Encounter: Payer: Self-pay | Admitting: General Surgery

## 2016-01-11 ENCOUNTER — Encounter: Payer: Self-pay | Admitting: *Deleted

## 2016-01-12 ENCOUNTER — Ambulatory Visit (INDEPENDENT_AMBULATORY_CARE_PROVIDER_SITE_OTHER): Payer: BC Managed Care – PPO | Admitting: General Surgery

## 2016-01-12 ENCOUNTER — Encounter: Payer: Self-pay | Admitting: General Surgery

## 2016-01-12 VITALS — BP 140/80 | HR 72 | Resp 14 | Ht 66.0 in | Wt 185.0 lb

## 2016-01-12 DIAGNOSIS — C50212 Malignant neoplasm of upper-inner quadrant of left female breast: Secondary | ICD-10-CM

## 2016-01-12 DIAGNOSIS — Z1211 Encounter for screening for malignant neoplasm of colon: Secondary | ICD-10-CM

## 2016-01-12 MED ORDER — POLYETHYLENE GLYCOL 3350 17 GM/SCOOP PO POWD: 1.0000 | Freq: Once | ORAL | 0 refills | Status: AC

## 2016-01-12 NOTE — Progress Notes (Signed)
Patient ID: Deanna Holder, female   DOB: Mar 15, 1956, 60 y.o.   MRN: [id number]  Chief Complaint  Patient presents with  . Follow-up    mammogram    HPI Deanna Holder is a 60 y.o. female who presents for a breast evaluation. The most recent mammogram was done on 01/06/16.  Patient does perform regular self breast checks and gets regular mammograms done.   No new breast issues. Her last colonoscopy was 2007. She stopped her Femara in July after breast cancer index was obtained. I have reviewed the history of present illness with the patient.  HPI  Past Medical History:  Diagnosis Date  . Cancer ([place]) 2012   left breast/ pT1b N0  . Diffuse cystic mastopathy   . Hypertension   . Personal history of malignant neoplasm of breast 2012  . Thyroid disease 2010    Past Surgical History:  Procedure Laterality Date  . BREAST LUMPECTOMY Left Aug 2012   ER/PR POS,HER 2 NEG  . BREAST SURGERY Left 2012   mammosite balloon placement  . CESAREAN SECTION  1985  . COLONOSCOPY  2007   Dr. Allen Norris  . TOOTH EXTRACTION  1970  . wisdom teeth removal  2008    Family History  Problem Relation Age of Onset  . Thyroid disease Mother   . Heart attack Father     Social History Social History  Substance Use Topics  . Smoking status: Never Smoker  . Smokeless tobacco: Never Used  . Alcohol use No    Allergies  Allergen Reactions  . Ampicillin Anaphylaxis  . Vibramycin [Doxycycline Calcium] Anaphylaxis    Current Outpatient Prescriptions  Medication Sig Dispense Refill  . levothyroxine (SYNTHROID, LEVOTHROID) 50 MCG tablet TAKE 1 TABLET BY MOUTH EVERY DAY ON AN EMPTY STOMACH 30 tablet 2  . polyethylene glycol powder (GLYCOLAX/MIRALAX) powder Take 255 g by mouth once. 255 g 0   No current facility-administered medications for this visit.     Review of Systems Review of Systems  Constitutional: Negative.   Respiratory: Negative.   Cardiovascular: Negative.     Blood  pressure 140/80, pulse 72, resp. rate 14, height 5\' 6"  (1.676 m), weight 185 lb (83.9 kg).  Physical Exam Physical Exam  Constitutional: She is oriented to person, place, and time. She appears well-developed and well-nourished.  HENT:  Mouth/Throat: Oropharynx is clear and moist.  Eyes: Conjunctivae are normal. No scleral icterus.  Neck: Neck supple.  Cardiovascular: Normal rate, regular rhythm and normal heart sounds.   Pulmonary/Chest: Effort normal and breath sounds normal. Right breast exhibits no inverted nipple, no mass, no nipple discharge, no skin change and no tenderness. Left breast exhibits no inverted nipple, no mass, no nipple discharge, no skin change and no tenderness.  Minimal scarring lumpectomy site left breast upper inner quadrant, unchanged from before.  Abdominal: Soft. Bowel sounds are normal. There is no tenderness.  Lymphadenopathy:    She has no cervical adenopathy.    She has no axillary adenopathy.  Neurological: She is alert and oriented to person, place, and time.  Skin: Skin is warm and dry.  Psychiatric: Her behavior is normal.    Data Reviewed Mammogram reviewed and stable.  Assessment    Stable exam . 5 years post lumpectomy, sentral node biopsy and radiation for stage 1cancer. Off letrazole now.. Need for screening colonoscopy-discussed with pt and she is agreeable.    Plan    The patient has been asked to return to the office  in one year with a bilateral diagnostic mammogram.  Colonoscopy with possible biopsy/polypectomy prn: Information regarding the procedure, including its potential risks and complications (including but not limited to perforation of the bowel, which may require emergency surgery to repair, and bleeding) was verbally given to the patient. Educational information regarding lower intestinal endoscopy was given to the patient. Written instructions for how to complete the bowel prep using Miralax were provided. The importance of  drinking ample fluids to avoid dehydration as a result of the prep emphasized.  The patient is scheduled for a Colonoscopy at [facility] on 01/18/16. They are aware to call the day before to get their arrival time. Miralax prescription has been sent into the patient's pharmacy. The patient is aware of date and instructions.         Vicky Mccanless G 01/12/2016, 11:43 AM

## 2016-01-12 NOTE — Patient Instructions (Addendum)
The patient is aware to call back for any questions or concerns. The patient has been asked to return to the office in one year with a bilateral diagnostic mammogram.  Colonoscopy A colonoscopy is an exam to look at the entire large intestine (colon). This exam can help find problems such as tumors, polyps, inflammation, and areas of bleeding. The exam takes about 1 hour.  LET [facility] CARE PROVIDER KNOW ABOUT:   Any allergies you have.  All medicines you are taking, including vitamins, herbs, eye drops, creams, and over-the-counter medicines.  Previous problems you or members of your family have had with the use of anesthetics.  Any blood disorders you have.  Previous surgeries you have had.  Medical conditions you have. RISKS AND COMPLICATIONS  Generally, this is a safe procedure. However, as with any procedure, complications can occur. Possible complications include:  Bleeding.  Tearing or rupture of the colon wall.  Reaction to medicines given during the exam.  Infection (rare). BEFORE THE PROCEDURE   Ask your health care provider about changing or stopping your regular medicines.  You may be prescribed an oral bowel prep. This involves drinking a large amount of medicated liquid, starting the day before your procedure. The liquid will cause you to have multiple loose stools until your stool is almost clear or light green. This cleans out your colon in preparation for the procedure.  Do not eat or drink anything else once you have started the bowel prep, unless your health care provider tells you it is safe to do so.  Arrange for someone to drive you home after the procedure. PROCEDURE   You will be given medicine to help you relax (sedative).  You will lie on your side with your knees bent.  A long, flexible tube with a light and camera on the end (colonoscope) will be inserted through the rectum and into the colon. The camera sends video back to a computer screen  as it moves through the colon. The colonoscope also releases carbon dioxide gas to inflate the colon. This helps your health care provider see the area better.  During the exam, your health care provider may take a small tissue sample (biopsy) to be examined under a microscope if any abnormalities are found.  The exam is finished when the entire colon has been viewed. AFTER THE PROCEDURE   Do not drive for 24 hours after the exam.  You may have a small amount of blood in your stool.  You may pass moderate amounts of gas and have mild abdominal cramping or bloating. This is caused by the gas used to inflate your colon during the exam.  Ask when your test results will be ready and how you will get your results. Make sure you get your test results.   This information is not intended to replace advice given to you by your health care provider. Make sure you discuss any questions you have with your health care provider.   Document Released: 05/04/2000 Document Revised: 02/25/2013 Document Reviewed: 01/12/2013 Elsevier Interactive Patient Education [employer].  The patient is scheduled for a Colonoscopy at [facility] on 01/18/16. They are aware to call the day before to get their arrival time. Miralax prescription has been sent into the patient's pharmacy. The patient is aware of date and instructions.

## 2016-01-17 ENCOUNTER — Encounter: Payer: Self-pay | Admitting: *Deleted

## 2016-01-18 ENCOUNTER — Encounter: Payer: Self-pay | Admitting: *Deleted

## 2016-01-18 ENCOUNTER — Ambulatory Visit: Payer: BC Managed Care – PPO | Admitting: Anesthesiology

## 2016-01-18 ENCOUNTER — Ambulatory Visit
Admission: RE | Admit: 2016-01-18 | Discharge: 2016-01-18 | Disposition: A | Discharge status: Home / Self Care | Payer: BC Managed Care – PPO | Source: Ambulatory Visit | Attending: General Surgery | Admitting: General Surgery

## 2016-01-18 ENCOUNTER — Encounter
Admission: RE | Disposition: A | Discharge status: Home / Self Care | Payer: Self-pay | Source: Ambulatory Visit | Attending: General Surgery

## 2016-01-18 DIAGNOSIS — Z9889 Other specified postprocedural states: Secondary | ICD-10-CM | POA: Diagnosis not present

## 2016-01-18 DIAGNOSIS — Z1211 Encounter for screening for malignant neoplasm of colon: Secondary | ICD-10-CM | POA: Diagnosis not present

## 2016-01-18 DIAGNOSIS — Z8349 Family history of other endocrine, nutritional and metabolic diseases: Secondary | ICD-10-CM | POA: Diagnosis not present

## 2016-01-18 DIAGNOSIS — Z923 Personal history of irradiation: Secondary | ICD-10-CM | POA: Diagnosis not present

## 2016-01-18 DIAGNOSIS — Z881 Allergy status to other antibiotic agents status: Secondary | ICD-10-CM | POA: Insufficient documentation

## 2016-01-18 DIAGNOSIS — Z79899 Other long term (current) drug therapy: Secondary | ICD-10-CM | POA: Insufficient documentation

## 2016-01-18 DIAGNOSIS — Z87892 Personal history of anaphylaxis: Secondary | ICD-10-CM | POA: Insufficient documentation

## 2016-01-18 DIAGNOSIS — I10 Essential (primary) hypertension: Secondary | ICD-10-CM | POA: Diagnosis not present

## 2016-01-18 DIAGNOSIS — E039 Hypothyroidism, unspecified: Secondary | ICD-10-CM | POA: Diagnosis not present

## 2016-01-18 DIAGNOSIS — Z8249 Family history of ischemic heart disease and other diseases of the circulatory system: Secondary | ICD-10-CM | POA: Insufficient documentation

## 2016-01-18 DIAGNOSIS — Z853 Personal history of malignant neoplasm of breast: Secondary | ICD-10-CM | POA: Diagnosis not present

## 2016-01-18 HISTORY — PX: COLONOSCOPY WITH PROPOFOL: SHX5780

## 2016-01-18 HISTORY — DX: Hypothyroidism, unspecified: E03.9

## 2016-01-18 SURGERY — COLONOSCOPY WITH PROPOFOL
Anesthesia: General

## 2016-01-18 MED ORDER — PROPOFOL 10 MG/ML IV BOLUS
INTRAVENOUS | Status: DC | PRN
  Administered 2016-01-18: 120 mg via INTRAVENOUS

## 2016-01-18 MED ORDER — SODIUM CHLORIDE 0.9 % IV SOLN
INTRAVENOUS | Status: DC
  Administered 2016-01-18: 08:00:00 via INTRAVENOUS

## 2016-01-18 MED ORDER — PROPOFOL 500 MG/50ML IV EMUL
INTRAVENOUS | Status: DC | PRN
  Administered 2016-01-18: 140 ug/kg/min via INTRAVENOUS

## 2016-01-18 MED ORDER — MIDAZOLAM HCL 2 MG/2ML IJ SOLN
INTRAMUSCULAR | Status: DC | PRN
  Administered 2016-01-18: 1 mg via INTRAVENOUS

## 2016-01-18 NOTE — Transfer of Care (Signed)
Immediate Anesthesia Transfer of Care Note  Patient: Deanna Holder  Procedure(s) Performed: Procedure(s): COLONOSCOPY WITH PROPOFOL (N/A)  Patient Location: PACU and Endoscopy Unit  Anesthesia Type:General  Level of Consciousness: patient cooperative and lethargic  Airway & Oxygen Therapy: Patient Spontanous Breathing and Patient connected to nasal cannula oxygen  Post-op Assessment: Report given to RN and Post -op Vital signs reviewed and stable  Post vital signs: Reviewed and stable  Last Vitals:  Vitals:   01/18/16 0820 01/18/16 0828  BP: (!) 88/46 (!) 88/46  Pulse: (!) 59   Resp: 18   Temp: (!) 35.7 C     Last Pain:  Vitals:   01/18/16 0820  TempSrc: Tympanic         Complications: No apparent anesthesia complications

## 2016-01-18 NOTE — Interval H&P Note (Signed)
History and Physical Interval Note:  01/18/2016 8:28 AM  Deanna Holder  has presented today for surgery, with the diagnosis of SCREEN  The various methods of treatment have been discussed with the patient and family. After consideration of risks, benefits and other options for treatment, the patient has consented to  Procedure(s): COLONOSCOPY WITH PROPOFOL (N/A) as a surgical intervention .  The patient's history has been reviewed, patient examined, no change in status, stable for surgery.  I have reviewed the patient's chart and labs.  Questions were answered to the patient's satisfaction.     SANKAR,SEEPLAPUTHUR G

## 2016-01-18 NOTE — H&P (View-Only) (Signed)
Patient ID: Deanna Holder, female   DOB: Mar 15, 1956, 60 y.o.   MRN: [id number]  Chief Complaint  Patient presents with  . Follow-up    mammogram    HPI Deanna Holder is a 60 y.o. female who presents for a breast evaluation. The most recent mammogram was done on 01/06/16.  Patient does perform regular self breast checks and gets regular mammograms done.   No new breast issues. Her last colonoscopy was 2007. She stopped her Femara in July after breast cancer index was obtained. I have reviewed the history of present illness with the patient.  HPI  Past Medical History:  Diagnosis Date  . Cancer ([place]) 2012   left breast/ pT1b N0  . Diffuse cystic mastopathy   . Hypertension   . Personal history of malignant neoplasm of breast 2012  . Thyroid disease 2010    Past Surgical History:  Procedure Laterality Date  . BREAST LUMPECTOMY Left Aug 2012   ER/PR POS,HER 2 NEG  . BREAST SURGERY Left 2012   mammosite balloon placement  . CESAREAN SECTION  1985  . COLONOSCOPY  2007   Dr. Allen Norris  . TOOTH EXTRACTION  1970  . wisdom teeth removal  2008    Family History  Problem Relation Age of Onset  . Thyroid disease Mother   . Heart attack Father     Social History Social History  Substance Use Topics  . Smoking status: Never Smoker  . Smokeless tobacco: Never Used  . Alcohol use No    Allergies  Allergen Reactions  . Ampicillin Anaphylaxis  . Vibramycin [Doxycycline Calcium] Anaphylaxis    Current Outpatient Prescriptions  Medication Sig Dispense Refill  . levothyroxine (SYNTHROID, LEVOTHROID) 50 MCG tablet TAKE 1 TABLET BY MOUTH EVERY DAY ON AN EMPTY STOMACH 30 tablet 2  . polyethylene glycol powder (GLYCOLAX/MIRALAX) powder Take 255 g by mouth once. 255 g 0   No current facility-administered medications for this visit.     Review of Systems Review of Systems  Constitutional: Negative.   Respiratory: Negative.   Cardiovascular: Negative.     Blood  pressure 140/80, pulse 72, resp. rate 14, height 5\' 6"  (1.676 m), weight 185 lb (83.9 kg).  Physical Exam Physical Exam  Constitutional: She is oriented to person, place, and time. She appears well-developed and well-nourished.  HENT:  Mouth/Throat: Oropharynx is clear and moist.  Eyes: Conjunctivae are normal. No scleral icterus.  Neck: Neck supple.  Cardiovascular: Normal rate, regular rhythm and normal heart sounds.   Pulmonary/Chest: Effort normal and breath sounds normal. Right breast exhibits no inverted nipple, no mass, no nipple discharge, no skin change and no tenderness. Left breast exhibits no inverted nipple, no mass, no nipple discharge, no skin change and no tenderness.  Minimal scarring lumpectomy site left breast upper inner quadrant, unchanged from before.  Abdominal: Soft. Bowel sounds are normal. There is no tenderness.  Lymphadenopathy:    She has no cervical adenopathy.    She has no axillary adenopathy.  Neurological: She is alert and oriented to person, place, and time.  Skin: Skin is warm and dry.  Psychiatric: Her behavior is normal.    Data Reviewed Mammogram reviewed and stable.  Assessment    Stable exam . 5 years post lumpectomy, sentral node biopsy and radiation for stage 1cancer. Off letrazole now.. Need for screening colonoscopy-discussed with pt and she is agreeable.    Plan    The patient has been asked to return to the office  in one year with a bilateral diagnostic mammogram.  Colonoscopy with possible biopsy/polypectomy prn: Information regarding the procedure, including its potential risks and complications (including but not limited to perforation of the bowel, which may require emergency surgery to repair, and bleeding) was verbally given to the patient. Educational information regarding lower intestinal endoscopy was given to the patient. Written instructions for how to complete the bowel prep using Miralax were provided. The importance of  drinking ample fluids to avoid dehydration as a result of the prep emphasized.  The patient is scheduled for a Colonoscopy at [facility] on 01/18/16. They are aware to call the day before to get their arrival time. Miralax prescription has been sent into the patient's pharmacy. The patient is aware of date and instructions.         Inioluwa Baris G 01/12/2016, 11:43 AM

## 2016-01-18 NOTE — Anesthesia Postprocedure Evaluation (Signed)
Anesthesia Post Note  Patient: Deanna Holder  Procedure(s) Performed: Procedure(s) (LRB): COLONOSCOPY WITH PROPOFOL (N/A)  Patient location during evaluation: Endoscopy Anesthesia Type: General Level of consciousness: awake and alert Pain management: pain level controlled Vital Signs Assessment: post-procedure vital signs reviewed and stable Respiratory status: spontaneous breathing and respiratory function stable Cardiovascular status: stable Anesthetic complications: no    Last Vitals:  Vitals:   01/18/16 0828 01/18/16 0840  BP: (!) 88/46 100/77  Pulse:    Resp:    Temp:      Last Pain:  Vitals:   01/18/16 0820  TempSrc: Tympanic                 Demetrios Byron K

## 2016-01-18 NOTE — Anesthesia Preprocedure Evaluation (Signed)
Anesthesia Evaluation  Patient identified by MRN, date of birth, ID band Patient awake    Reviewed: Allergy & Precautions, NPO status , Patient's Chart, lab work & pertinent test results  History of Anesthesia Complications Negative for: history of anesthetic complications  Airway Mallampati: III       Dental   Pulmonary neg pulmonary ROS,           Cardiovascular negative cardio ROS       Neuro/Psych negative neurological ROS     GI/Hepatic negative GI ROS, Neg liver ROS,   Endo/Other  Hypothyroidism   Renal/GU negative Renal ROS     Musculoskeletal   Abdominal   Peds  Hematology negative hematology ROS (+)   Anesthesia Other Findings   Reproductive/Obstetrics                             Anesthesia Physical Anesthesia Plan  ASA: II  Anesthesia Plan: General   Post-op Pain Management:    Induction: Intravenous  Airway Management Planned: Nasal Cannula  Additional Equipment:   Intra-op Plan:   Post-operative Plan:   Informed Consent: I have reviewed the patients History and Physical, chart, labs and discussed the procedure including the risks, benefits and alternatives for the proposed anesthesia with the patient or authorized representative who has indicated his/her understanding and acceptance.     Plan Discussed with:   Anesthesia Plan Comments:         Anesthesia Quick Evaluation

## 2016-01-18 NOTE — Op Note (Signed)
[facility] Patient Name: Deanna Holder Procedure Date: 01/18/2016 7:51 AM MRN: [id number] Account #: [id number] Date of Birth: June 21, 1955 Admit Type: Outpatient Age: 60 Room: [facility] ENDO ROOM 3 Gender: Female Note Status: Finalized Procedure:            Colonoscopy Indications:          Screening for colorectal malignant neoplasm Providers:            Shanigua Gibb G. Jamal Collin, MD Referring MD:         Ashok Norris, MD (Referring MD) Medicines:            General Anesthesia Complications:        No immediate complications. Procedure:            Pre-Anesthesia Assessment:                       - General anesthesia under the supervision of an                        anesthesiologist was determined to be medically                        necessary for this procedure based on review of the                        patient's medical history, medications, and prior                        anesthesia history.                       After obtaining informed consent, the colonoscope was                        passed under direct vision. Throughout the procedure,                        the patient's blood pressure, pulse, and oxygen                        saturations were monitored continuously. The                        Colonoscope was introduced through the anus and                        advanced to the the cecum, identified by the ileocecal                        valve. The colonoscopy was performed with ease. The                        patient tolerated the procedure well. The quality of                        the bowel preparation was excellent. Findings:      The perianal and digital rectal examinations were normal.      The entire examined colon appeared normal on direct and retroflexion       views. Impression:           - The entire  examined colon is normal on direct and                        retroflexion views.                       - No  specimens collected. Recommendation:       - Discharge patient to home.                       - Resume regular diet.                       - Return to my office as previously scheduled. Procedure Code(s):    --- Professional ---                       [phone], Colonoscopy, flexible; diagnostic, including                        collection of specimen(s) by brushing or washing, when                        performed (separate procedure) Diagnosis Code(s):    --- Professional ---                       Z12.11, Encounter for screening for malignant neoplasm                        of colon CPT copyright 2016 American Medical Association. All rights reserved. The codes documented in this report are preliminary and upon coder review may  be revised to meet current compliance requirements. Christene Lye, MD 01/18/2016 8:25:07 AM This report has been signed electronically. Number of Addenda: 0 Note Initiated On: 01/18/2016 7:51 AM Scope Withdrawal Time: 0 hours 4 minutes 14 seconds  Total Procedure Duration: 0 hours 17 minutes 32 seconds       [facility]

## 2016-01-19 ENCOUNTER — Encounter: Payer: Self-pay | Admitting: General Surgery

## 2016-03-01 ENCOUNTER — Other Ambulatory Visit: Payer: Self-pay | Admitting: Family Medicine

## 2016-03-08 NOTE — Telephone Encounter (Signed)
Requesting refill on Levothyroxine. Please send to [facility] [address]. Patient is completely out.

## 2016-03-12 ENCOUNTER — Telehealth: Payer: Self-pay | Admitting: Family Medicine

## 2016-03-12 NOTE — Telephone Encounter (Signed)
Pt states she has been out of her Levothyroxine for a week and has requested a refill a few times. [facility] [address]

## 2016-03-12 NOTE — Telephone Encounter (Signed)
I have never seen her and will not be able to see her before I go on vacation. Send to the provider who will be seeing her. Thank you

## 2016-03-13 ENCOUNTER — Other Ambulatory Visit: Payer: Self-pay | Admitting: Family Medicine

## 2016-03-13 MED ORDER — LEVOTHYROXINE SODIUM 50 MCG PO TABS: ORAL_TABLET | ORAL | 2 refills | Status: DC

## 2016-03-13 NOTE — Telephone Encounter (Signed)
Done. Thank you.

## 2016-03-13 NOTE — Telephone Encounter (Signed)
Yes mame you saw her 12/06/2015

## 2016-05-16 ENCOUNTER — Other Ambulatory Visit: Payer: Self-pay

## 2016-05-16 NOTE — Telephone Encounter (Signed)
Patient requesting refill of Levothyroxine for a 90 day supply to [facility] 

## 2016-05-18 ENCOUNTER — Other Ambulatory Visit: Payer: Self-pay

## 2016-05-18 NOTE — Telephone Encounter (Signed)
Patient requesting 90 day supply of [facility]roxine to [facility].

## 2016-05-19 MED ORDER — LEVOTHYROXINE SODIUM 50 MCG PO TABS: ORAL_TABLET | ORAL | 0 refills | Status: DC

## 2016-05-22 ENCOUNTER — Encounter: Payer: Self-pay | Admitting: Family Medicine

## 2016-05-22 ENCOUNTER — Ambulatory Visit (INDEPENDENT_AMBULATORY_CARE_PROVIDER_SITE_OTHER): Payer: BC Managed Care – PPO | Admitting: Family Medicine

## 2016-05-22 VITALS — BP 124/64 | HR 99 | Temp 98.3°F | Resp 16 | Wt 188.0 lb

## 2016-05-22 DIAGNOSIS — R509 Fever, unspecified: Secondary | ICD-10-CM | POA: Diagnosis not present

## 2016-05-22 DIAGNOSIS — Z853 Personal history of malignant neoplasm of breast: Secondary | ICD-10-CM

## 2016-05-22 DIAGNOSIS — R6889 Other general symptoms and signs: Secondary | ICD-10-CM

## 2016-05-22 LAB — POCT INFLUENZA A/B
INFLUENZA A, POC: NEGATIVE
Influenza B, POC: NEGATIVE

## 2016-05-22 MED ORDER — AZITHROMYCIN 250 MG PO TABS
ORAL_TABLET | ORAL | 0 refills | Status: AC
Start: 2016-05-22 — End: 2016-05-27

## 2016-05-22 NOTE — Patient Instructions (Signed)
Start the antibiotic Please do eat yogurt daily or take a probiotic daily for the next month We want to replace the healthy germs in the gut If you notice foul, watery diarrhea in the next two months, schedule an appointment RIGHT AWAY Try vitamin C (orange juice if not diabetic or vitamin C tablets) and drink green tea to help your immune system during your illness Get plenty of rest and hydration

## 2016-05-22 NOTE — Assessment & Plan Note (Signed)
Six year survivor, follows with Dr. Jamal Collin

## 2016-05-22 NOTE — Progress Notes (Signed)
BP 124/64   Pulse 99   Temp 98.3 F (36.8 C) (Oral)   Resp 16   Wt 188 lb (85.3 kg)   LMP  (LMP Unknown)   SpO2 96%   BMI 32.27 kg/m    Subjective:    Patient ID: Deanna Holder, female    DOB: 1955-06-29, 61 y.o.   MRN: [id number]  HPI: Deanna Holder is a 61 y.o. female  Chief Complaint  Patient presents with  . URI    onset last thursday symptoms include: fever, congestion, fatigue, and cough   She got sick on Thursday; fever has been 100.8, hovering at 100.5 with ibuprofen; today fever-free finally Fatigue, cough; not anything coming up; can't smell, ears pop when blowing nose; really bad chills No rash No cruise or travel No hx of pneumonia, but has had bronchitis Did get a flu shot this year No real body aches Having pressure and pain with sinuses; blowing stuff out; pretty clear; may be sinus infection, not sure Ears do not hurt Felt lymph nodes in the neck on Thursday, but nothing since No GI symptoms  Depression screen [facility] 2/9 05/22/2016 12/06/2015 06/13/2015 12/07/2014  Decreased Interest 0 0 0 0  Down, Depressed, Hopeless 0 0 0 0  PHQ - 2 Score 0 0 0 0   Relevant past medical, surgical, family and social history reviewed Past Medical History:  Diagnosis Date  . Cancer ([place]) 2012   left breast/ pT1b N0  . Diffuse cystic mastopathy   . Hypothyroidism   . Personal history of malignant neoplasm of breast 2012  . Thyroid disease 2010   Past Surgical History:  Procedure Laterality Date  . BREAST LUMPECTOMY Left Aug 2012   ER/PR POS,HER 2 NEG  . BREAST SURGERY Left 2012   mammosite balloon placement  . CESAREAN SECTION  1985  . COLONOSCOPY  2007   Dr. Allen Norris  . COLONOSCOPY WITH PROPOFOL N/A 01/18/2016   Procedure: COLONOSCOPY WITH PROPOFOL;  Surgeon: Christene Lye, MD;  Location: [facility] ENDOSCOPY;  Service: Endoscopy;  Laterality: N/A;  . TOOTH EXTRACTION  1970  . wisdom teeth removal  2008   Social History  Substance Use Topics  .  Smoking status: Never Smoker  . Smokeless tobacco: Never Used  . Alcohol use No   Interim medical history since last visit reviewed. Allergies and medications reviewed  Review of Systems Per HPI unless specifically indicated above     Objective:    BP 124/64   Pulse 99   Temp 98.3 F (36.8 C) (Oral)   Resp 16   Wt 188 lb (85.3 kg)   LMP  (LMP Unknown)   SpO2 96%   BMI 32.27 kg/m   Wt Readings from Last 3 Encounters:  05/22/16 188 lb (85.3 kg)  01/18/16 181 lb (82.1 kg)  01/12/16 185 lb (83.9 kg)    Physical Exam  Constitutional: She appears well-developed and well-nourished. No distress.  HENT:  Right Ear: Hearing, tympanic membrane, external ear and ear canal normal.  Left Ear: Hearing, tympanic membrane, external ear and ear canal normal.  Nose: Rhinorrhea present.  Mouth/Throat: Oropharynx is clear and moist.  Eyes: EOM are normal. Right conjunctiva is not injected. Left conjunctiva is not injected. No scleral icterus.  Neck: No thyromegaly present.  Cardiovascular: Normal rate.   Pulmonary/Chest: Effort normal. No accessory muscle usage. No respiratory distress. She has no decreased breath sounds. She has no wheezes. She has rhonchi.  Scattered rhonchi, left and  right  Abdominal: She exhibits no distension.  Lymphadenopathy:    She has no cervical adenopathy.  Skin: No rash noted. No pallor.  Psychiatric: She has a normal mood and affect. Her behavior is normal. Judgment and thought content normal.    Results for orders placed or performed in visit on 05/22/16  POCT Influenza A/B  Result Value Ref Range   Influenza A, POC Negative Negative   Influenza B, POC Negative Negative      Assessment & Plan:   Problem List Items Addressed This Visit      Other   History of breast cancer    Six year survivor, follows with Dr. Jamal Collin       Other Visit Diagnoses    Flu-like symptoms    -  Primary   flu A and B negative; rest, supportive care, reasons to call,  seek attention reviewed   Relevant Orders   POCT Influenza A/B (Completed)   Fever, unspecified fever cause       may have started as viral process, now poss secondary bacterial infection; start azithromycin; reasons to call reviewed; will spare her CXR w/hx breast cancer   Relevant Orders   POCT Influenza A/B (Completed)      Follow up plan: No Follow-up on file.  An after-visit summary was printed and given to the patient at [place].  Please see the patient instructions which may contain other information and recommendations beyond what is mentioned above in the assessment and plan.  Meds ordered this encounter  Medications  . azithromycin (ZITHROMAX) 250 MG tablet    Sig: Two pills by mouth today, then one pill daily for four more days    Dispense:  6 tablet    Refill:  0    Orders Placed This Encounter  Procedures  . POCT Influenza A/B

## 2016-06-08 ENCOUNTER — Encounter: Payer: Self-pay | Admitting: Family Medicine

## 2016-06-08 ENCOUNTER — Ambulatory Visit (INDEPENDENT_AMBULATORY_CARE_PROVIDER_SITE_OTHER): Payer: BC Managed Care – PPO | Admitting: Family Medicine

## 2016-06-08 VITALS — BP 128/72 | HR 91 | Temp 98.3°F | Resp 16 | Ht 66.0 in | Wt 193.3 lb

## 2016-06-08 DIAGNOSIS — E785 Hyperlipidemia, unspecified: Secondary | ICD-10-CM | POA: Diagnosis not present

## 2016-06-08 DIAGNOSIS — E039 Hypothyroidism, unspecified: Secondary | ICD-10-CM | POA: Diagnosis not present

## 2016-06-08 DIAGNOSIS — R05 Cough: Secondary | ICD-10-CM | POA: Diagnosis not present

## 2016-06-08 DIAGNOSIS — R5383 Other fatigue: Secondary | ICD-10-CM

## 2016-06-08 DIAGNOSIS — R739 Hyperglycemia, unspecified: Secondary | ICD-10-CM | POA: Diagnosis not present

## 2016-06-08 DIAGNOSIS — R059 Cough, unspecified: Secondary | ICD-10-CM

## 2016-06-08 LAB — CBC WITH DIFFERENTIAL/PLATELET
Basophils Absolute: 0 cells/uL (ref 0–200)
Basophils Relative: 0 %
EOS PCT: 4 %
Eosinophils Absolute: 256 cells/uL (ref 15–500)
HCT: 39.6 % (ref 35.0–45.0)
HEMOGLOBIN: 13.1 g/dL (ref 11.7–15.5)
LYMPHS ABS: 1728 {cells}/uL (ref 850–3900)
Lymphocytes Relative: 27 %
MCH: 30 pg (ref 27.0–33.0)
MCHC: 33.1 g/dL (ref 32.0–36.0)
MCV: 90.6 fL (ref 80.0–100.0)
MPV: 10 fL (ref 7.5–12.5)
Monocytes Absolute: 512 cells/uL (ref 200–950)
Monocytes Relative: 8 %
NEUTROS ABS: 3904 {cells}/uL (ref 1500–7800)
Neutrophils Relative %: 61 %
Platelets: 312 10*3/uL (ref 140–400)
RBC: 4.37 MIL/uL (ref 3.80–5.10)
RDW: 13.2 % (ref 11.0–15.0)
WBC: 6.4 10*3/uL (ref 3.8–10.8)

## 2016-06-08 LAB — COMPLETE METABOLIC PANEL WITH GFR
ALBUMIN: 4.1 g/dL (ref 3.6–5.1)
ALK PHOS: 98 U/L (ref 33–130)
ALT: 40 U/L — AB (ref 6–29)
AST: 26 U/L (ref 10–35)
BILIRUBIN TOTAL: 0.3 mg/dL (ref 0.2–1.2)
BUN: 15 mg/dL (ref 7–25)
CO2: 31 mmol/L (ref 20–31)
Calcium: 9.7 mg/dL (ref 8.6–10.4)
Chloride: 102 mmol/L (ref 98–110)
Creat: 0.96 mg/dL (ref 0.50–0.99)
GFR, Est African American: 74 mL/min (ref >=60)
GFR, Est Non African American: 64 mL/min (ref >=60)
GLUCOSE: 96 mg/dL (ref 65–99)
Potassium: 4.7 mmol/L (ref 3.5–5.3)
SODIUM: 139 mmol/L (ref 135–146)
TOTAL PROTEIN: 7.1 g/dL (ref 6.1–8.1)

## 2016-06-08 LAB — TSH: TSH: 2.26 mIU/L

## 2016-06-08 LAB — VITAMIN B12: Vitamin B-12: 326 pg/mL (ref 200–1100)

## 2016-06-08 MED ORDER — LEVOTHYROXINE SODIUM 50 MCG PO TABS: ORAL_TABLET | ORAL | 1 refills | Status: DC

## 2016-06-08 MED ORDER — BUDESONIDE-FORMOTEROL FUMARATE 160-4.5 MCG/ACT IN AERO: 2.0000 | INHALATION_SPRAY | Freq: Two times a day (BID) | RESPIRATORY_TRACT | 0 refills | Status: DC

## 2016-06-08 MED ORDER — PREDNISONE 20 MG PO TABS: 20.0000 mg | ORAL_TABLET | Freq: Every day | ORAL | 0 refills | Status: DC

## 2016-06-08 NOTE — Progress Notes (Signed)
Name: Deanna Holder   MRN: [id number]    DOB: Apr 01, 1956   Date:06/08/2016       Progress Note  Subjective  Chief Complaint  Chief Complaint  Patient presents with  . Thyroid Problem  . Medication Refill  . Back Pain    pt recently had pnuemonia and stated that she is still having pain in her upper back area    HPI  History of breast cancer: diagnosed in 2012 and still follows up with Dr. Jamal Holder, stopped Femara Summer  2017  Hypothyroidism: she was diagnosed with hypothyroidism around 2010, taking levothyroxine 50 mcg daily, she denies dry skin, no constipation, but she has been feeling very tired over the past couple of months. No energy and not being able to exercise. Denies sadness or seasonal affective disorder  Obesity: losing weight since January 2017. Starting weight was 202 lbs, and was down to 184 lbs Summer 2017, but gained some weight back over the past couple of months. She has been sick for a while. Started with URI, treated for CAP early January, still not back to normal  Hyperglycemia: she denies polyphagia, polydipsia or polyuria. Losing weight.   Cough: she has been sick since Nov, had a URI, improved, got sick again Dec 28th, and was seen by Dr. Sanda Holder on 05/22/2016 and treated with a Zpack. Still has a dry cough, intermittent right upper back pain, no wheezing or SOB, but is very tired and is not going to the gym. No fever. Tired of feeling tired.  Patient Active Problem List   Diagnosis Date Noted  . Hyperglycemia 06/13/2015  . Hypothyroidism (acquired) 06/13/2015  . Adult BMI 30+ 02/07/2015  . Menopause 02/07/2015  . History of breast cancer 01/13/2013  . Allergic rhinitis 10/22/2006    Past Surgical History:  Procedure Laterality Date  . BREAST LUMPECTOMY Left Aug 2012   ER/PR POS,HER 2 NEG  . BREAST SURGERY Left 2012   mammosite balloon placement  . CESAREAN SECTION  1985  . COLONOSCOPY  2007   Dr. Allen Holder  . COLONOSCOPY WITH PROPOFOL N/A  01/18/2016   Procedure: COLONOSCOPY WITH PROPOFOL;  Surgeon: Deanna Lye, MD;  Location: [facility] ENDOSCOPY;  Service: Endoscopy;  Laterality: N/A;  . TOOTH EXTRACTION  1970  . wisdom teeth removal  2008    Family History  Problem Relation Age of Onset  . Thyroid disease Mother   . Heart attack Father     Social History   Social History  . Marital status: Divorced    Spouse name: N/A  . Number of children: N/A  . Years of education: N/A   Occupational History  . Not on file.   Social History Main Topics  . Smoking status: Never Smoker  . Smokeless tobacco: Never Used  . Alcohol use No  . Drug use: No  . Sexual activity: Not on file   Other Topics Concern  . Not on file   Social History Narrative  . No narrative on file     Current Outpatient Prescriptions:  .  budesonide-formoterol (SYMBICORT) 160-4.5 MCG/ACT inhaler, Inhale 2 puffs into the lungs 2 (two) times daily., Disp: 1 Inhaler, Rfl: 0 .  levothyroxine (SYNTHROID, LEVOTHROID) 50 MCG tablet, TAKE 1 TABLET BY MOUTH EVERY DAY ON AN EMPTY STOMACH, Disp: 90 tablet, Rfl: 1 .  predniSONE (DELTASONE) 20 MG tablet, Take 1 tablet (20 mg total) by mouth daily with breakfast., Disp: 10 tablet, Rfl: 0  Allergies  Allergen Reactions  . Ampicillin  Anaphylaxis  . Vibramycin [Doxycycline Calcium] Anaphylaxis     ROS  Constitutional: Negative for fever, positive for  weight change.  Respiratory: Positive  for cough , no  shortness of breath.   Cardiovascular: Negative for chest pain or palpitations.  Gastrointestinal: Negative for abdominal pain, no bowel changes.  Musculoskeletal: Negative for gait problem or joint swelling.  Skin: Negative for rash.  Neurological: Negative for dizziness or headache.  No other specific complaints in a complete review of systems (except as listed in HPI above).  Objective  Vitals:   06/08/16 1329  BP: 128/72  Pulse: 91  Resp: 16  Temp: 98.3 F (36.8 C)  SpO2: 95%   Weight: 193 lb 5 oz (87.7 kg)  Height: 5\' 6"  (1.676 m)    Body mass index is 31.2 kg/m.  Physical Exam  Constitutional: Patient appears well-developed and well-nourished. Obese  No distress.  HEENT: head atraumatic, normocephalic, pupils equal and reactive to light, ears TM,  neck supple, throat within normal limits Cardiovascular: Normal rate, regular rhythm and normal heart sounds.  No murmur heard. No BLE edema. Pulmonary/Chest: Effort normal and breath sounds normal. No respiratory distress. Abdominal: Soft.  There is no tenderness. Psychiatric: Patient has a normal mood and affect. behavior is normal. Judgment and thought content normal.  Recent Results (from the past 2160 hour(s))  POCT Influenza A/B     Status: Normal   Collection Time: 05/22/16  2:59 PM  Result Value Ref Range   Influenza A, POC Negative Negative   Influenza B, POC Negative Negative     PHQ2/9: Depression screen Deanna Holder 2/9 06/08/2016 05/22/2016 12/06/2015 06/13/2015 12/07/2014  Decreased Interest 0 0 0 0 0  Down, Depressed, Hopeless 0 0 0 0 0  PHQ - 2 Score 0 0 0 0 0    Fall Risk: Fall Risk  06/08/2016 05/22/2016 12/06/2015 06/13/2015 12/07/2014  Falls in the past year? No No No No No    Functional Status Survey: Is the patient deaf or have difficulty hearing?: No Does the patient have difficulty seeing, even when wearing glasses/contacts?: No Does the patient have difficulty concentrating, remembering, or making decisions?: No Does the patient have difficulty walking or climbing stairs?: No Does the patient have difficulty dressing or bathing?: No Does the patient have difficulty doing errands alone such as visiting a doctor's office or shopping?: No   Assessment & Plan  1. Hyperglycemia  Discussed life style modification   2. Dyslipidemia  Resume life style modification and we will recheck in the Summer  3. Hypothyroidism (acquired)  Feeling more than usual for the past 2 months, we will recheck  TSH - levothyroxine (SYNTHROID, LEVOTHROID) 50 MCG tablet; TAKE 1 TABLET BY MOUTH EVERY DAY ON AN EMPTY STOMACH  Dispense: 90 tablet; Refill: 1 - TSH  4. Cough  Treated with antibiotics for CAP a couple of weeks ago and still has a mild cough and some pain on right upper chest. Discussed CXR, but she prefers trying therapy and she will call back for CXR if no improvement by next week  - predniSONE (DELTASONE) 20 MG tablet; Take 1 tablet (20 mg total) by mouth daily with breakfast.  Dispense: 10 tablet; Refill: 0 - budesonide-formoterol (SYMBICORT) 160-4.5 MCG/ACT inhaler; Inhale 2 puffs into the lungs 2 (two) times daily.  Dispense: 1 Inhaler; Refill: 0 - CBC with Differential/Platelet  5. Other fatigue  - Vitamin B12 - VITAMIN D 25 Hydroxy (Vit-D Deficiency, Fractures) - CBC with Differential/Platelet - COMPLETE METABOLIC  PANEL WITH GFR

## 2016-06-09 ENCOUNTER — Other Ambulatory Visit: Payer: Self-pay | Admitting: Family Medicine

## 2016-06-09 LAB — VITAMIN D 25 HYDROXY (VIT D DEFICIENCY, FRACTURES): VIT D 25 HYDROXY: 15 ng/mL — AB (ref 30–100)

## 2016-06-09 MED ORDER — VITAMIN D (ERGOCALCIFEROL) 1.25 MG (50000 UNIT) PO CAPS: 50000.0000 [IU] | ORAL_CAPSULE | ORAL | 0 refills | Status: DC

## 2016-06-11 ENCOUNTER — Telehealth: Payer: Self-pay | Admitting: Family Medicine

## 2016-06-11 NOTE — Telephone Encounter (Signed)
Was able to speak to pt and go over her lab results

## 2016-06-11 NOTE — Telephone Encounter (Signed)
Pt states she missed a call and her VM is full and would like a call back please.

## 2016-07-06 ENCOUNTER — Telehealth: Payer: Self-pay | Admitting: Family Medicine

## 2016-07-06 DIAGNOSIS — R059 Cough, unspecified: Secondary | ICD-10-CM

## 2016-07-06 DIAGNOSIS — R05 Cough: Secondary | ICD-10-CM

## 2016-07-06 NOTE — Telephone Encounter (Signed)
Patient requesting refill of [facility]ort [facility].  

## 2016-07-10 NOTE — Telephone Encounter (Signed)
Pt would like to see you, stated that she has you listed on her insurance card. Also, stated that she does not need the symbicort prescription and that she had told the pharmacy that as well.

## 2016-07-10 NOTE — Telephone Encounter (Signed)
Ask who she wants to see

## 2016-07-10 NOTE — Telephone Encounter (Signed)
Please advise: you have seen this patient twice and she has upcoming appointment with you. However, she has seen Dr Sanda Klein once for acute visit. It also has it listed as you being the PCP. Who should we schedule this patient with you or Dr Sanda Klein?

## 2016-07-31 ENCOUNTER — Encounter: Payer: Self-pay | Admitting: Family Medicine

## 2016-07-31 ENCOUNTER — Ambulatory Visit (INDEPENDENT_AMBULATORY_CARE_PROVIDER_SITE_OTHER): Payer: BC Managed Care – PPO | Admitting: Family Medicine

## 2016-07-31 VITALS — BP 130/84 | HR 89 | Temp 100.7°F | Resp 16 | Ht 66.0 in | Wt 194.2 lb

## 2016-07-31 DIAGNOSIS — R509 Fever, unspecified: Secondary | ICD-10-CM

## 2016-07-31 DIAGNOSIS — J029 Acute pharyngitis, unspecified: Secondary | ICD-10-CM | POA: Diagnosis not present

## 2016-07-31 DIAGNOSIS — J019 Acute sinusitis, unspecified: Secondary | ICD-10-CM

## 2016-07-31 LAB — POCT INFLUENZA A/B
Influenza A, POC: NEGATIVE
Influenza B, POC: NEGATIVE

## 2016-07-31 LAB — POCT RAPID STREP A (OFFICE): RAPID STREP A SCREEN: NEGATIVE

## 2016-07-31 MED ORDER — AZITHROMYCIN 250 MG PO TABS: ORAL_TABLET | ORAL | 0 refills | Status: DC

## 2016-07-31 NOTE — Progress Notes (Signed)
Name: Deanna Holder   MRN: [id number]    DOB: 02-10-1956   Date:07/31/2016       Progress Note  Subjective  Chief Complaint  Chief Complaint  Patient presents with  . Fever    cough, sore throat, swollen glands for 5 days    Sore Throat   This is a new problem. The current episode started in the past 7 days (5 days ago). The fever has been present for 5 days or more. Associated symptoms include coughing, ear pain (itchiness inside the ears) and swollen glands. Pertinent negatives include no headaches, shortness of breath or trouble swallowing. She has had no exposure to strep. She has tried NSAIDs for the symptoms. The treatment provided moderate relief.    Past Medical History:  Diagnosis Date  . Cancer ([place]) 2012   left breast/ pT1b N0  . Diffuse cystic mastopathy   . Hypothyroidism   . Personal history of malignant neoplasm of breast 2012  . Thyroid disease 2010    Past Surgical History:  Procedure Laterality Date  . BREAST LUMPECTOMY Left Aug 2012   ER/PR POS,HER 2 NEG  . BREAST SURGERY Left 2012   mammosite balloon placement  . CESAREAN SECTION  1985  . COLONOSCOPY  2007   Dr. Allen Norris  . COLONOSCOPY WITH PROPOFOL N/A 01/18/2016   Procedure: COLONOSCOPY WITH PROPOFOL;  Surgeon: Christene Lye, MD;  Location: [facility] ENDOSCOPY;  Service: Endoscopy;  Laterality: N/A;  . TOOTH EXTRACTION  1970  . wisdom teeth removal  2008    Family History  Problem Relation Age of Onset  . Thyroid disease Mother   . Heart attack Father     Social History   Social History  . Marital status: Divorced    Spouse name: N/A  . Number of children: N/A  . Years of education: N/A   Occupational History  . Not on file.   Social History Main Topics  . Smoking status: Never Smoker  . Smokeless tobacco: Never Used  . Alcohol use No  . Drug use: No  . Sexual activity: Not on file   Other Topics Concern  . Not on file   Social History Narrative  . No narrative on file      Current Outpatient Prescriptions:  .  budesonide-formoterol (SYMBICORT) 160-4.5 MCG/ACT inhaler, Inhale 2 puffs into the lungs 2 (two) times daily., Disp: 1 Inhaler, Rfl: 0 .  levothyroxine (SYNTHROID, LEVOTHROID) 50 MCG tablet, TAKE 1 TABLET BY MOUTH EVERY DAY ON AN EMPTY STOMACH, Disp: 90 tablet, Rfl: 1 .  predniSONE (DELTASONE) 20 MG tablet, Take 1 tablet (20 mg total) by mouth daily with breakfast., Disp: 10 tablet, Rfl: 0 .  Vitamin D, Ergocalciferol, (DRISDOL) 50000 units CAPS capsule, Take 1 capsule (50,000 Units total) by mouth every 7 (seven) days., Disp: 12 capsule, Rfl: 0  Allergies  Allergen Reactions  . Ampicillin Anaphylaxis  . Vibramycin [Doxycycline Calcium] Anaphylaxis     Review of Systems  HENT: Positive for ear pain (itchiness inside the ears). Negative for trouble swallowing.   Respiratory: Positive for cough. Negative for shortness of breath.   Neurological: Negative for headaches.     Objective  Vitals:   07/31/16 1005  BP: 130/84  Pulse: 89  Resp: 16  Temp: (!) 100.7 F (38.2 C)  TempSrc: Oral  SpO2: 97%  Weight: 194 lb 3.2 oz (88.1 kg)  Height: 5\' 6"  (1.676 m)    Physical Exam  Constitutional: She is well-developed, well-nourished, and  in no distress.  HENT:  Head: Normocephalic and atraumatic.  Right Ear: Tympanic membrane and ear canal normal. No drainage or swelling.  Left Ear: Tympanic membrane and ear canal normal. No drainage or swelling.  Nose: Right sinus exhibits no maxillary sinus tenderness and no frontal sinus tenderness. Left sinus exhibits no maxillary sinus tenderness and no frontal sinus tenderness.  Mouth/Throat: Posterior oropharyngeal erythema present. No oropharyngeal exudate or posterior oropharyngeal edema.  Cardiovascular: Normal rate, regular rhythm and normal heart sounds.   No murmur heard. Pulmonary/Chest: Effort normal and breath sounds normal. She has no wheezes.  Lymphadenopathy:    She has cervical  adenopathy.       Right cervical: Superficial cervical adenopathy present.  Nursing note and vitals reviewed.      Assessment & Plan  1. Fever and chills Point-of-care flu A and B are both negative - POCT Influenza A/B  2. Sore throat Rapid strep screen is negative - POCT rapid strep A  3. Acute sinusitis, recurrence not specified, unspecified location By history and exam, started on azithromycin. - azithromycin (ZITHROMAX) 250 MG tablet; 2 tabs po day 1, then 1 tab po q day x 4 days  Dispense: 6 tablet; Refill: 0   [place] [facility] 07/31/2016 10:30 AM

## 2016-08-31 ENCOUNTER — Other Ambulatory Visit: Payer: Self-pay | Admitting: Family Medicine

## 2016-11-05 ENCOUNTER — Other Ambulatory Visit: Payer: Self-pay

## 2016-11-05 DIAGNOSIS — Z170 Estrogen receptor positive status [ER+]: Principal | ICD-10-CM

## 2016-11-05 DIAGNOSIS — C50212 Malignant neoplasm of upper-inner quadrant of left female breast: Secondary | ICD-10-CM

## 2016-11-07 ENCOUNTER — Other Ambulatory Visit: Payer: Self-pay | Admitting: *Deleted

## 2016-11-07 ENCOUNTER — Inpatient Hospital Stay
Admission: RE | Admit: 2016-11-07 | Discharge: 2016-11-07 | Disposition: A | Discharge status: Home / Self Care | Payer: Self-pay | Source: Ambulatory Visit | Attending: *Deleted | Admitting: *Deleted

## 2016-11-07 DIAGNOSIS — Z9289 Personal history of other medical treatment: Secondary | ICD-10-CM

## 2016-11-28 ENCOUNTER — Other Ambulatory Visit: Payer: Self-pay | Admitting: Family Medicine

## 2016-11-28 NOTE — Telephone Encounter (Signed)
Patient [facility] 

## 2016-12-06 ENCOUNTER — Ambulatory Visit (INDEPENDENT_AMBULATORY_CARE_PROVIDER_SITE_OTHER): Payer: BC Managed Care – PPO | Admitting: Family Medicine

## 2016-12-06 ENCOUNTER — Encounter: Payer: Self-pay | Admitting: Family Medicine

## 2016-12-06 VITALS — BP 132/80 | HR 75 | Temp 97.8°F | Resp 16 | Ht 63.0 in | Wt 197.9 lb

## 2016-12-06 DIAGNOSIS — E669 Obesity, unspecified: Secondary | ICD-10-CM

## 2016-12-06 DIAGNOSIS — E559 Vitamin D deficiency, unspecified: Secondary | ICD-10-CM

## 2016-12-06 DIAGNOSIS — R739 Hyperglycemia, unspecified: Secondary | ICD-10-CM | POA: Diagnosis not present

## 2016-12-06 DIAGNOSIS — Z130 Encounter for screening for diseases of the blood and blood-forming organs and certain disorders involving the immune mechanism: Secondary | ICD-10-CM | POA: Diagnosis not present

## 2016-12-06 DIAGNOSIS — Z79899 Other long term (current) drug therapy: Secondary | ICD-10-CM

## 2016-12-06 DIAGNOSIS — E538 Deficiency of other specified B group vitamins: Secondary | ICD-10-CM

## 2016-12-06 DIAGNOSIS — E039 Hypothyroidism, unspecified: Secondary | ICD-10-CM | POA: Diagnosis not present

## 2016-12-06 DIAGNOSIS — E785 Hyperlipidemia, unspecified: Secondary | ICD-10-CM | POA: Diagnosis not present

## 2016-12-06 LAB — CBC WITH DIFFERENTIAL/PLATELET
BASOS ABS: 0 {cells}/uL (ref 0–200)
Basophils Relative: 0 %
EOS PCT: 3 %
Eosinophils Absolute: 156 cells/uL (ref 15–500)
HCT: 40.9 % (ref 35.0–45.0)
Hemoglobin: 13.4 g/dL (ref 11.7–15.5)
Lymphocytes Relative: 31 %
Lymphs Abs: 1612 cells/uL (ref 850–3900)
MCH: 30 pg (ref 27.0–33.0)
MCHC: 32.8 g/dL (ref 32.0–36.0)
MCV: 91.7 fL (ref 80.0–100.0)
MPV: 9.7 fL (ref 7.5–12.5)
Monocytes Absolute: 364 cells/uL (ref 200–950)
Monocytes Relative: 7 %
NEUTROS PCT: 59 %
Neutro Abs: 3068 cells/uL (ref 1500–7800)
Platelets: 252 10*3/uL (ref 140–400)
RBC: 4.46 MIL/uL (ref 3.80–5.10)
RDW: 13.1 % (ref 11.0–15.0)
WBC: 5.2 10*3/uL (ref 3.8–10.8)

## 2016-12-06 MED ORDER — CYANOCOBALAMIN 1000 MCG SL SUBL: 1.0000 | SUBLINGUAL_TABLET | Freq: Every day | SUBLINGUAL | 0 refills | Status: DC

## 2016-12-06 MED ORDER — VITAMIN D (CHOLECALCIFEROL) 25 MCG (1000 UT) PO CAPS: 1.0000 | ORAL_CAPSULE | Freq: Every day | ORAL | 0 refills | Status: DC

## 2016-12-06 NOTE — Progress Notes (Signed)
Name: Deanna Holder   MRN: [id number]    DOB: 05/12/1956   Date:12/06/2016       Progress Note  Subjective  Chief Complaint  Chief Complaint  Patient presents with  . Follow-up    6 mo  . Medication Refill    Levothyroxine    HPI  History of breast cancer: diagnosed in 2012 and still follows up with Dr. Jamal Collin, stopped Femara Summer  2017  Hypothyroidism: she was diagnosed with hypothyroidism around 2010, taking levothyroxine 50 mcg daily, she denies dry skin, no constipation. Denies sadness or seasonal affective disorder. She is compliant with medication.   Obesity:she started an exercise program 05/2015 and was down to 184 lbs Summer 2017, she has busy and on vacation, she will resume activity and try to lose weight again.   Hyperglycemia: she denies polyphagia, polydipsia or polyuria. Gained a little weight, discussed life style modification   Hyperlipidemia: she does not need statin therapy, we will recheck labs, no chest pain or palpitation    Patient Active Problem List   Diagnosis Date Noted  . Hyperglycemia 06/13/2015  . Hypothyroidism (acquired) 06/13/2015  . Obesity (BMI 30.0-34.9) 02/07/2015  . Menopause 02/07/2015  . History of breast cancer 01/13/2013  . Allergic rhinitis 10/22/2006    Past Surgical History:  Procedure Laterality Date  . BREAST LUMPECTOMY Left Aug 2012   ER/PR POS,HER 2 NEG  . BREAST SURGERY Left 2012   mammosite balloon placement  . CESAREAN SECTION  1985  . COLONOSCOPY  2007   Dr. Allen Norris  . COLONOSCOPY WITH PROPOFOL N/A 01/18/2016   Procedure: COLONOSCOPY WITH PROPOFOL;  Surgeon: Christene Lye, MD;  Location: [facility] ENDOSCOPY;  Service: Endoscopy;  Laterality: N/A;  . TOOTH EXTRACTION  1970  . wisdom teeth removal  2008    Family History  Problem Relation Age of Onset  . Thyroid disease Mother   . Heart attack Father     Social History   Social History  . Marital status: Divorced    Spouse name: N/A  . Number  of children: N/A  . Years of education: N/A   Occupational History  . Not on file.   Social History Main Topics  . Smoking status: Never Smoker  . Smokeless tobacco: Never Used  . Alcohol use No  . Drug use: No  . Sexual activity: Not on file   Other Topics Concern  . Not on file   Social History Narrative   She is divorced, lives alone.    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      Current Outpatient Prescriptions:  .  levothyroxine (SYNTHROID, LEVOTHROID) 50 MCG tablet, TAKE 1 TABLET BY MOUTH EVERY DAY ON AN EMPTY STOMACH, Disp: 90 tablet, Rfl: 1 .  Cyanocobalamin (B-12-SL) 1000 MCG SUBL, Place 1 tablet (1,000 mcg total) under the tongue daily., Disp: 30 tablet, Rfl: 0 .  Vitamin D, Cholecalciferol, 1000 units CAPS, Take 1 capsule by mouth daily., Disp: 30 capsule, Rfl: 0  Allergies  Allergen Reactions  . Ampicillin Anaphylaxis  . Vibramycin [Doxycycline Calcium] Anaphylaxis     ROS  Constitutional: Negative for fever or significant weight change.  Respiratory: Negative for cough and shortness of breath.   Cardiovascular: Negative for chest pain or palpitations.  Gastrointestinal: Negative for abdominal pain, no bowel changes.  Musculoskeletal: Negative for gait problem or  joint swelling.  Skin: Negative for rash.  Neurological: Negative for dizziness or headache.  No other specific complaints in a complete review of systems (except as listed in HPI above).  Objective  Vitals:   12/06/16 0755  BP: 132/80  Pulse: 75  Resp: 16  Temp: 97.8 F (36.6 C)  TempSrc: Oral  SpO2: 96%  Weight: 197 lb 14.4 oz (89.8 kg)  Height: 5\' 3"  (1.6 m)    Body mass index is 35.06 kg/m.  Physical Exam  Constitutional: Patient appears well-developed and well-nourished. Obese  No distress.  HEENT: head atraumatic, normocephalic, pupils equal and reactive to light,  neck  supple, throat within normal limits Cardiovascular: Normal rate, regular rhythm and normal heart sounds.  No murmur heard. No BLE edema. Pulmonary/Chest: Effort normal and breath sounds normal. No respiratory distress. Abdominal: Soft.  There is no tenderness. Psychiatric: Patient has a normal mood and affect. behavior is normal. Judgment and thought content normal.  PHQ2/9: Depression screen [facility] 2/9 12/06/2016 06/08/2016 05/22/2016 12/06/2015 06/13/2015  Decreased Interest 0 0 0 0 0  Down, Depressed, Hopeless 0 0 0 0 0  PHQ - 2 Score 0 0 0 0 0     Fall Risk: Fall Risk  12/06/2016 06/08/2016 05/22/2016 12/06/2015 06/13/2015  Falls in the past year? No No No No No     Functional Status Survey: Is the patient deaf or have difficulty hearing?: No Does the patient have difficulty seeing, even when wearing glasses/contacts?: Yes (reading glasses) Does the patient have difficulty concentrating, remembering, or making decisions?: No Does the patient have difficulty walking or climbing stairs?: No Does the patient have difficulty dressing or bathing?: No Does the patient have difficulty doing errands alone such as visiting a doctor's office or shopping?: No    Assessment & Plan   1. Dyslipidemia  - Lipid panel  2. Hypothyroidism (acquired)  - TSH  3. Hyperglycemia  - Hemoglobin A1c - Insulin, fasting  4. Vitamin D deficiency  - Vitamin D, Cholecalciferol, 1000 units CAPS; Take 1 capsule by mouth daily.  Dispense: 30 capsule; Refill: 0  5. B12 deficiency  - Cyanocobalamin (B-12-SL) 1000 MCG SUBL; Place 1 tablet (1,000 mcg total) under the tongue daily.  Dispense: 30 tablet; Refill: 0  6. Screening for deficiency anemia  - CBC with Differential/Platelet  7. Long-term use of high-risk medication  - COMPLETE METABOLIC PANEL WITH GFR  8. Obesity (BMI 30.0-34.9)  Discussed with the patient the risk posed by an increased BMI. Discussed importance of portion control, calorie  counting and at least 150 minutes of physical activity weekly. Avoid sweet beverages and drink more water. Eat at least 6 servings of fruit and vegetables daily

## 2016-12-07 LAB — LIPID PANEL
CHOLESTEROL: 202 mg/dL — AB (ref <200)
HDL: 61 mg/dL (ref >50)
LDL CALC: 123 mg/dL — AB (ref <100)
TRIGLYCERIDES: 88 mg/dL (ref <150)
Total CHOL/HDL Ratio: 3.3 Ratio (ref <5.0)
VLDL: 18 mg/dL (ref <30)

## 2016-12-07 LAB — HEMOGLOBIN A1C
HEMOGLOBIN A1C: 5.8 % — AB (ref <5.7)
MEAN PLASMA GLUCOSE: 120 mg/dL

## 2016-12-07 LAB — COMPLETE METABOLIC PANEL WITH GFR
ALBUMIN: 3.8 g/dL (ref 3.6–5.1)
ALK PHOS: 81 U/L (ref 33–130)
ALT: 15 U/L (ref 6–29)
AST: 17 U/L (ref 10–35)
BILIRUBIN TOTAL: 0.4 mg/dL (ref 0.2–1.2)
BUN: 15 mg/dL (ref 7–25)
CALCIUM: 9.3 mg/dL (ref 8.6–10.4)
CO2: 25 mmol/L (ref 20–31)
CREATININE: 0.88 mg/dL (ref 0.50–0.99)
Chloride: 104 mmol/L (ref 98–110)
GFR, Est African American: 82 mL/min (ref >=60)
GFR, Est Non African American: 71 mL/min (ref >=60)
Glucose, Bld: 97 mg/dL (ref 65–99)
Potassium: 4.4 mmol/L (ref 3.5–5.3)
Sodium: 141 mmol/L (ref 135–146)
TOTAL PROTEIN: 6.7 g/dL (ref 6.1–8.1)

## 2016-12-07 LAB — TSH: TSH: 2.23 mIU/L

## 2016-12-07 LAB — INSULIN, FASTING: INSULIN FASTING, SERUM: 10.3 u[IU]/mL (ref 2.0–19.6)

## 2016-12-18 ENCOUNTER — Other Ambulatory Visit: Payer: Self-pay | Admitting: Family Medicine

## 2016-12-18 DIAGNOSIS — E039 Hypothyroidism, unspecified: Secondary | ICD-10-CM

## 2016-12-18 NOTE — Telephone Encounter (Signed)
[facility]  

## 2017-01-07 ENCOUNTER — Ambulatory Visit
Admission: RE | Admit: 2017-01-07 | Discharge: 2017-01-07 | Disposition: A | Discharge status: Home / Self Care | Payer: BC Managed Care – PPO | Source: Ambulatory Visit | Attending: General Surgery | Admitting: General Surgery

## 2017-01-07 DIAGNOSIS — Z170 Estrogen receptor positive status [ER+]: Principal | ICD-10-CM

## 2017-01-07 DIAGNOSIS — C50212 Malignant neoplasm of upper-inner quadrant of left female breast: Secondary | ICD-10-CM

## 2017-01-07 HISTORY — DX: Personal history of irradiation: Z92.3

## 2017-01-15 ENCOUNTER — Ambulatory Visit (INDEPENDENT_AMBULATORY_CARE_PROVIDER_SITE_OTHER): Payer: BC Managed Care – PPO | Admitting: General Surgery

## 2017-01-15 ENCOUNTER — Encounter: Payer: Self-pay | Admitting: General Surgery

## 2017-01-15 VITALS — BP 134/76 | HR 70 | Resp 12 | Ht 63.0 in | Wt 196.0 lb

## 2017-01-15 DIAGNOSIS — Z853 Personal history of malignant neoplasm of breast: Secondary | ICD-10-CM | POA: Diagnosis not present

## 2017-01-15 NOTE — Patient Instructions (Addendum)
The patient has been asked to return to the office in one year with a bilateral diagnostic mammogram.The patient is aware to call back for any questions or concerns. 

## 2017-01-15 NOTE — Progress Notes (Signed)
Patient ID: Deanna Holder, female   DOB: 1956-04-27, 61 y.o.   MRN: [id number]  Chief Complaint  Patient presents with  . Follow-up    HPI Deanna Holder is a 61 y.o. female who presents for a breast cancer follow-up. The most recent mammogram was done on 01/07/2017. No breast symptoms and no health issues Patient does perform regular self breast checks and gets regular mammograms done.    HPI  Past Medical History:  Diagnosis Date  . Cancer ([place]) 2012   left breast/ pT1b N0  . Diffuse cystic mastopathy   . Hypothyroidism   . Personal history of malignant neoplasm of breast 2012  . Personal history of radiation therapy   . Thyroid disease 2010    Past Surgical History:  Procedure Laterality Date  . BREAST BIOPSY Left 2012   Positive  . BREAST LUMPECTOMY Left Aug 2012   ER/PR POS,HER 2 NEG  . BREAST SURGERY Left 2012   mammosite balloon placement  . CESAREAN SECTION  1985  . COLONOSCOPY  2007   Dr. Allen Norris  . COLONOSCOPY WITH PROPOFOL N/A 01/18/2016   Procedure: COLONOSCOPY WITH PROPOFOL;  Surgeon: Christene Lye, MD;  Location: [facility] ENDOSCOPY;  Service: Endoscopy;  Laterality: N/A;  . TOOTH EXTRACTION  1970  . wisdom teeth removal  2008    Family History  Problem Relation Age of Onset  . Thyroid disease Mother   . Heart attack Father   . Breast cancer Paternal Aunt     Social History Social History  Substance Use Topics  . Smoking status: Never Smoker  . Smokeless tobacco: Never Used  . Alcohol use No    Allergies  Allergen Reactions  . Ampicillin Anaphylaxis  . Vibramycin [Doxycycline Calcium] Anaphylaxis    Current Outpatient Prescriptions  Medication Sig Dispense Refill  . Cyanocobalamin (B-12-SL) 1000 MCG SUBL Place 1 tablet (1,000 mcg total) under the tongue daily. 30 tablet 0  . levothyroxine (SYNTHROID, LEVOTHROID) 50 MCG tablet TAKE 1 TABLET BY MOUTH EVERY DAY ON AN EMPTY STOMACH 90 tablet 1  . Vitamin D, Cholecalciferol, 1000  units CAPS Take 1 capsule by mouth daily. 30 capsule 0   No current facility-administered medications for this visit.     Review of Systems Review of Systems  Constitutional: Negative.   Respiratory: Negative.   Cardiovascular: Negative.     Blood pressure 134/76, pulse 70, resp. rate 12, height 5\' 3"  (1.6 m), weight 196 lb (88.9 kg).  Physical Exam Physical Exam  Constitutional: She is oriented to person, place, and time. She appears well-developed and well-nourished.  Eyes: Conjunctivae are normal.  Neck: Neck supple.  Cardiovascular: Normal rate, regular rhythm and normal heart sounds.   Pulmonary/Chest: Effort normal and breath sounds normal. Right breast exhibits no inverted nipple, no mass, no nipple discharge, no skin change and no tenderness. Left breast exhibits no inverted nipple, no mass, no nipple discharge, no skin change and no tenderness.    Abdominal: Soft. Bowel sounds are normal. There is no hepatomegaly. There is no tenderness.  Lymphadenopathy:    She has no cervical adenopathy.    She has no axillary adenopathy.  Neurological: She is alert and oriented to person, place, and time.  Skin: Skin is warm and dry.    Data Reviewed  Mammogram reviewed. Findings in the right breast warrant an ultrasound which revealed some normal-appearing ducts with no intraductal masses. A diagnostic mammogram a year was recommended   Assessment    Stable  exam.   6 years post lumpectomy, sentral node biopsy and radiation for stage 1 left breast cancer.       Plan    The patient has been asked to return to the office in one year with a bilateral diagnostic mammogram with Dr. Bary Castilla. The patient is aware to call back for any questions or concerns.   HPI, Physical Exam, Assessment and Plan have been scribed under the direction and in the presence of Mckinley Jewel, MD  Gaspar Cola, CMA  I have completed the exam and reviewed the above documentation for accuracy and  completeness.  I agree with the above.  Haematologist has been used and any errors in dictation or transcription are unintentional.  Stony Stegmann G. Jamal Collin, M.D., F.A.C.S.  Junie Panning G 01/15/2017, 2:04 PM

## 2017-02-13 ENCOUNTER — Telehealth: Payer: Self-pay | Admitting: *Deleted

## 2017-02-13 NOTE — Telephone Encounter (Signed)
Patient called and had lymph nodes removed on 12/22/2010 and she is involved in a study, I believe she said through her work (Phone was breaking up) and she wanted to know how many lymph nodes were removed that day. Report is in epic. I took a look at it and it looks like 2? But I wanted you to look at it to make sure I'm not missing something.

## 2017-02-13 NOTE — Telephone Encounter (Signed)
Notified patient as instructed by Dr Jamal Collin- 2 nodes, patient pleased.

## 2017-06-10 ENCOUNTER — Encounter: Payer: Self-pay | Admitting: Family Medicine

## 2017-06-10 ENCOUNTER — Ambulatory Visit (INDEPENDENT_AMBULATORY_CARE_PROVIDER_SITE_OTHER): Payer: BC Managed Care – PPO | Admitting: Family Medicine

## 2017-06-10 VITALS — BP 138/80 | HR 75 | Temp 97.8°F | Resp 14 | Ht 62.5 in | Wt 195.0 lb

## 2017-06-10 DIAGNOSIS — Z01419 Encounter for gynecological examination (general) (routine) without abnormal findings: Secondary | ICD-10-CM

## 2017-06-10 DIAGNOSIS — Z124 Encounter for screening for malignant neoplasm of cervix: Secondary | ICD-10-CM | POA: Diagnosis not present

## 2017-06-10 NOTE — Progress Notes (Signed)
Name: Deanna Holder   MRN: [id number]    DOB: 1955-11-09   Date:06/10/2017       Progress Note  Subjective  Chief Complaint  Chief Complaint  Patient presents with  . Annual Exam    HPI   Patient presents for annual CPE   Diet: she packs lunch and eats three times a week, and eats out with friend twice a week, usually eats hard boiled egg and hot tea with honey, cooks dinner at home  Exercise: continue elliptical   USPSTF grade A and B recommendations  Depression:  Depression screen [facility] 2/9 06/10/2017 12/06/2016 06/08/2016 05/22/2016 12/06/2015  Decreased Interest 0 0 0 0 0  Down, Depressed, Hopeless 0 0 0 0 0  PHQ - 2 Score 0 0 0 0 0   Hypertension: BP Readings from Last 3 Encounters:  06/10/17 138/80  01/15/17 134/76  12/06/16 132/80   Obesity: Wt Readings from Last 3 Encounters:  06/10/17 195 lb (88.5 kg)  01/15/17 196 lb (88.9 kg)  12/06/16 197 lb 14.4 oz (89.8 kg)   BMI Readings from Last 3 Encounters:  06/10/17 35.10 kg/m  01/15/17 34.72 kg/m  12/06/16 35.06 kg/m    hep C: up to date  STD testing and prevention (chl/gon/syphilis): N/A Intimate partner violence:negative screen  Sexual History/Pain during Intercourse: not currently sexually active - not past 5 years Menstrual History/LMP/Abnormal Bleeding: post-menopausal  Incontinence Symptoms: none    Advanced Care Planning: A voluntary discussion about advance care planning including the explanation and discussion of advance directives.  Discussed health care proxy and Living will, and the patient was able to identify a health care proxy as son Deanna Holder   Patient does have a living will at present time. If patient does have living will, I have requested they bring this to the clinic to be scanned in to their chart.  Breast cancer:  HM Mammogram  Date Value Ref Range Status  12/30/2012 Self Reported Normal 0-4 Bi-Rad, Self Reported Normal Final    Comment:    History of Breast Cancer   Cervical  cancer screening: 06/10/2017  Osteoporosis:  HM Dexa Scan  Date Value Ref Range Status  12/24/2013 Major Osteoporotic Fracture  Final    Fall prevention/vitamin D: on vitamin D supplementation  Lipids:  Lab Results  Component Value Date   CHOL 202 (H) 12/06/2016   CHOL 213 (H) 12/06/2015   CHOL 229 (H) 12/16/2014   Lab Results  Component Value Date   HDL 61 12/06/2016   HDL 55 12/06/2015   HDL 61 12/16/2014   Lab Results  Component Value Date   LDLCALC 123 (H) 12/06/2016   LDLCALC 137 (H) 12/06/2015   LDLCALC 146 (H) 12/16/2014   Lab Results  Component Value Date   TRIG 88 12/06/2016   TRIG 107 12/06/2015   TRIG 112 12/16/2014   Lab Results  Component Value Date   CHOLHDL 3.3 12/06/2016   CHOLHDL 3.9 12/06/2015   CHOLHDL 3.8 12/16/2014   No results found for: LDLDIRECT  Glucose:  Glucose  Date Value Ref Range Status  02/04/2014 100 (H) 65 - 99 mg/dL Final  08/06/2013 101 (H) 65 - 99 mg/dL Final  01/26/2013 105 (H) 65 - 99 mg/dL Final   Glucose, Bld  Date Value Ref Range Status  12/06/2016 97 65 - 99 mg/dL Final  06/08/2016 96 65 - 99 mg/dL Final  12/06/2015 100 (H) 65 - 99 mg/dL Final    Skin cancer: sees Dermatologist - Dr.  Phillip Heal  Colorectal cancer: 62 years old. Lung cancer:   Low Dose CT Chest recommended if Age 40-80 years, 30 pack-year currently smoking OR have quit w/in 15years. Patient does not qualify.   Aspirin:  Aspirin 81 mg daily  ECG: she will do it next visit    Patient Active Problem List   Diagnosis Date Noted  . Hyperglycemia 06/13/2015  . Hypothyroidism (acquired) 06/13/2015  . Obesity (BMI 30.0-34.9) 02/07/2015  . Menopause 02/07/2015  . History of breast cancer 01/13/2013  . Allergic rhinitis 10/22/2006    Past Surgical History:  Procedure Laterality Date  . BREAST BIOPSY Left 2012   Positive  . BREAST LUMPECTOMY Left Aug 2012   ER/PR POS,HER 2 NEG  . BREAST SURGERY Left 2012   mammosite balloon placement  .  CESAREAN SECTION  1985  . COLONOSCOPY  2007   Dr. Allen Norris  . COLONOSCOPY WITH PROPOFOL N/A 01/18/2016   Procedure: COLONOSCOPY WITH PROPOFOL;  Surgeon: Christene Lye, MD;  Location: [facility] ENDOSCOPY;  Service: Endoscopy;  Laterality: N/A;  . TOOTH EXTRACTION  1970  . wisdom teeth removal  2008    Family History  Problem Relation Age of Onset  . Thyroid disease Mother   . Heart attack Father   . Hypertension Father   . Breast cancer Paternal Aunt     Social History   Socioeconomic History  . Marital status: Divorced    Spouse name: Not on file  . Number of children: 3  . Years of education: Not on file  . Highest education level: Bachelor's degree (e.g., BA, AB, BS)  Social Needs  . Financial resource strain: Not very hard  . Food insecurity - worry: Never true  . Food insecurity - inability: Never true  . Transportation needs - medical: No  . Transportation needs - non-medical: No  Occupational History  . Occupation: Scientist, clinical (histocompatibility and immunogenetics) of court  Tobacco Use  . Smoking status: Never Smoker  . Smokeless tobacco: Never Used  Substance and Sexual Activity  . Alcohol use: No  . Drug use: No  . Sexual activity: Not Currently    Birth control/protection: Abstinence  Other Topics Concern  . Not on file  Social History Narrative   She is divorced, lives alone.    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      Current Outpatient Medications:  .  Cyanocobalamin (B-12-SL) 1000 MCG SUBL, Place 1 tablet (1,000 mcg total) under the tongue daily., Disp: 30 tablet, Rfl: 0 .  levothyroxine (SYNTHROID, LEVOTHROID) 50 MCG tablet, TAKE 1 TABLET BY MOUTH EVERY DAY ON AN EMPTY STOMACH, Disp: 90 tablet, Rfl: 1 .  Vitamin D, Cholecalciferol, 1000 units CAPS, Take 1 capsule by mouth daily., Disp: 30 capsule, Rfl: 0  Allergies  Allergen Reactions  . Ampicillin Anaphylaxis  . Vibramycin  [Doxycycline Calcium] Anaphylaxis     ROS   Constitutional: Negative for fever or weight change.  Respiratory: Negative for cough and shortness of breath.   Cardiovascular: Negative for chest pain or palpitations.  Gastrointestinal: Negative for abdominal pain, no bowel changes.  Musculoskeletal: Negative for gait problem or joint swelling.  Skin: Negative for rash.  Neurological: Negative for dizziness or headache.  No other specific complaints in a complete review of systems (except as listed in HPI above).   Objective  Vitals:   06/10/17 0829  BP:  138/80  Pulse: 75  Resp: 14  Temp: 97.8 F (36.6 C)  TempSrc: Oral  SpO2: 96%  Weight: 195 lb (88.5 kg)  Height: 5' 2.5" (1.588 m)    Body mass index is 35.1 kg/m.  Physical Exam  Constitutional: Patient appears well-developed and well-nourished. No distress.  HENT: Head: Normocephalic and atraumatic. Ears: B TMs ok, no erythema or effusion; Nose: Nose normal. Mouth/Throat: Oropharynx is clear and moist. No oropharyngeal exudate.  Eyes: Conjunctivae and EOM are normal. Pupils are equal, round, and reactive to light. No scleral icterus.  Neck: Normal range of motion. Neck supple. No JVD present. No thyromegaly present.  Cardiovascular: Normal rate, regular rhythm and normal heart sounds.  No murmur heard. No BLE edema. Pulmonary/Chest: Effort normal and breath sounds normal. No respiratory distress. Abdominal: Soft. Bowel sounds are normal, no distension. There is no tenderness. no masses Breast: no lumps or masses, no nipple discharge or rashes FEMALE GENITALIA:  External genitalia normal External urethra normal Vaginal vault normal without discharge or lesions Cervix normal without discharge or lesions Bimanual exam normal without masses RECTAL: not done  Musculoskeletal: Normal range of motion, no joint effusions. No gross deformities Neurological: he is alert and oriented to person, place, and time. No cranial  nerve deficit. Coordination, balance, strength, speech and gait are normal.  Skin: Skin is warm and dry. No rash noted. No erythema.  Psychiatric: Patient has a normal mood and affect. behavior is normal. Judgment and thought content normal.    PHQ2/9: Depression screen [facility] 2/9 06/10/2017 12/06/2016 06/08/2016 05/22/2016 12/06/2015  Decreased Interest 0 0 0 0 0  Down, Depressed, Hopeless 0 0 0 0 0  PHQ - 2 Score 0 0 0 0 0    Fall Risk: Fall Risk  06/10/2017 12/06/2016 06/08/2016 05/22/2016 12/06/2015  Falls in the past year? No No No No No    Functional Status Survey: Is the patient deaf or have difficulty hearing?: No Does the patient have difficulty seeing, even when wearing glasses/contacts?: No Does the patient have difficulty concentrating, remembering, or making decisions?: No Does the patient have difficulty walking or climbing stairs?: No Does the patient have difficulty dressing or bathing?: No Does the patient have difficulty doing errands alone such as visiting a doctor's office or shopping?: No   Assessment & Plan  1. Well woman exam  Discussed importance of 150 minutes of physical activity weekly, eat two servings of fish weekly, eat one serving of tree nuts ( cashews, pistachios, pecans, almonds.Marland Kitchen) every other day, eat 6 servings of fruit/vegetables daily and drink plenty of water and avoid sweet beverages.   2. Cervical cancer screening  - Pap IG and HPV (high risk) DNA detection

## 2017-06-12 LAB — PAP IG AND HPV HIGH-RISK: HPV DNA HIGH RISK: NOT DETECTED

## 2017-06-28 ENCOUNTER — Other Ambulatory Visit: Payer: Self-pay

## 2017-06-28 DIAGNOSIS — E039 Hypothyroidism, unspecified: Secondary | ICD-10-CM

## 2017-06-28 MED ORDER — LEVOTHYROXINE SODIUM 50 MCG PO TABS: ORAL_TABLET | ORAL | 1 refills | Status: DC

## 2017-06-28 NOTE — Telephone Encounter (Signed)
Refill request for thyroid medication: Levothyroxine 50 mcg  Last Physical: 06/10/2017  Lab Results  Component Value Date   TSH 2.23 12/06/2016    Follow-up on file. 12/09/2017

## 2017-09-02 IMAGING — MG MM DIGITAL DIAGNOSTIC BILAT W/ TOMO W/ CAD
8 of 13 series · 8 of 29 positions shown · non-contrast
Comparison: Previous exam(s).

CLINICAL DATA: History of treated left breast cancer, status post
lumpectomy and radiation therapy in [id number].

EXAM:
2D DIGITAL DIAGNOSTIC BILATERAL MAMMOGRAM WITH CAD AND ADJUNCT TOMO
ULTRASOUND RIGHT BREAST

[L CC (1 of 2)]
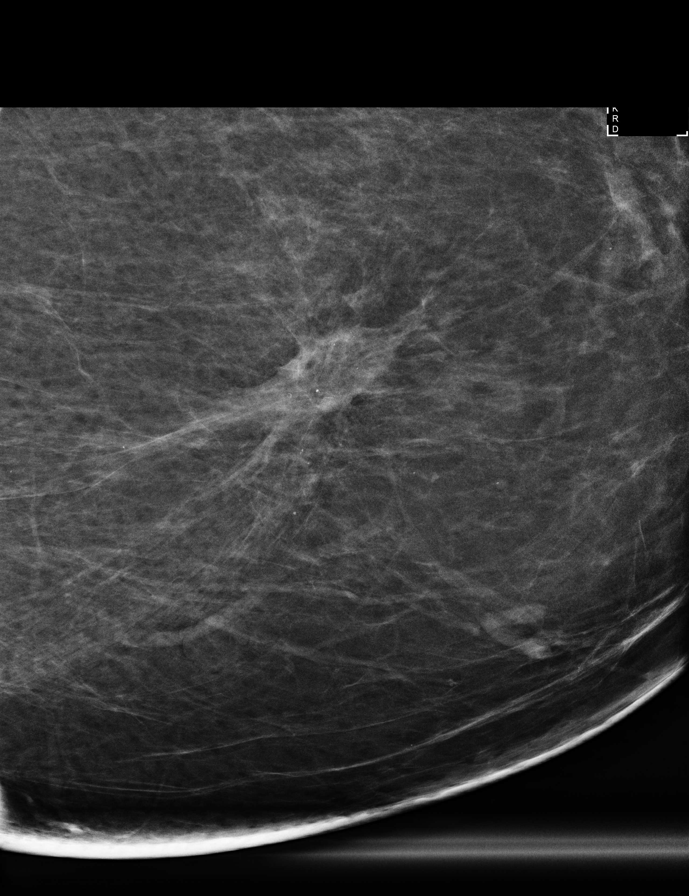

[R MLO]
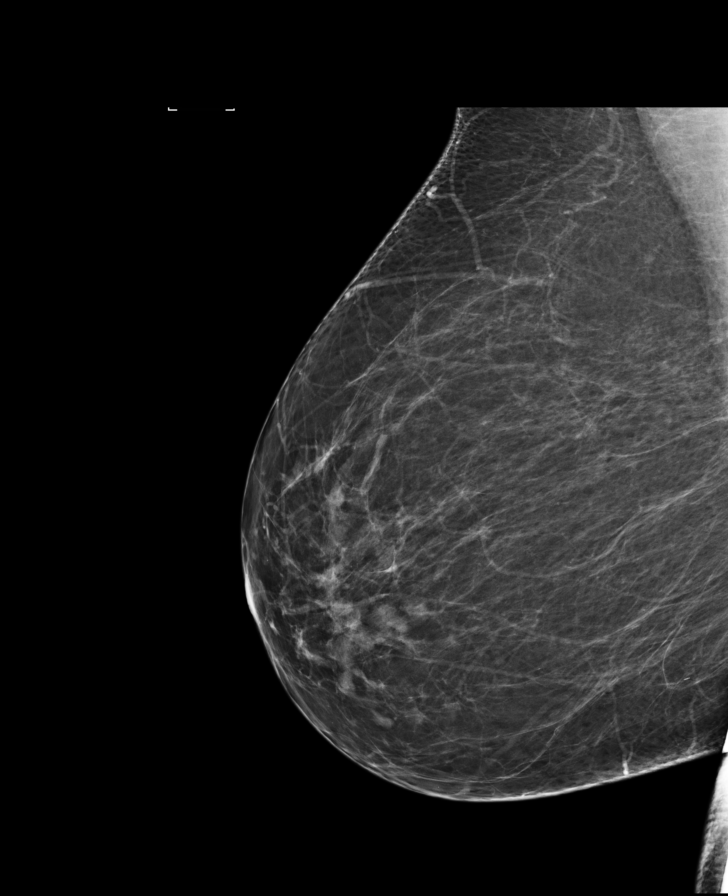

[L MLO]
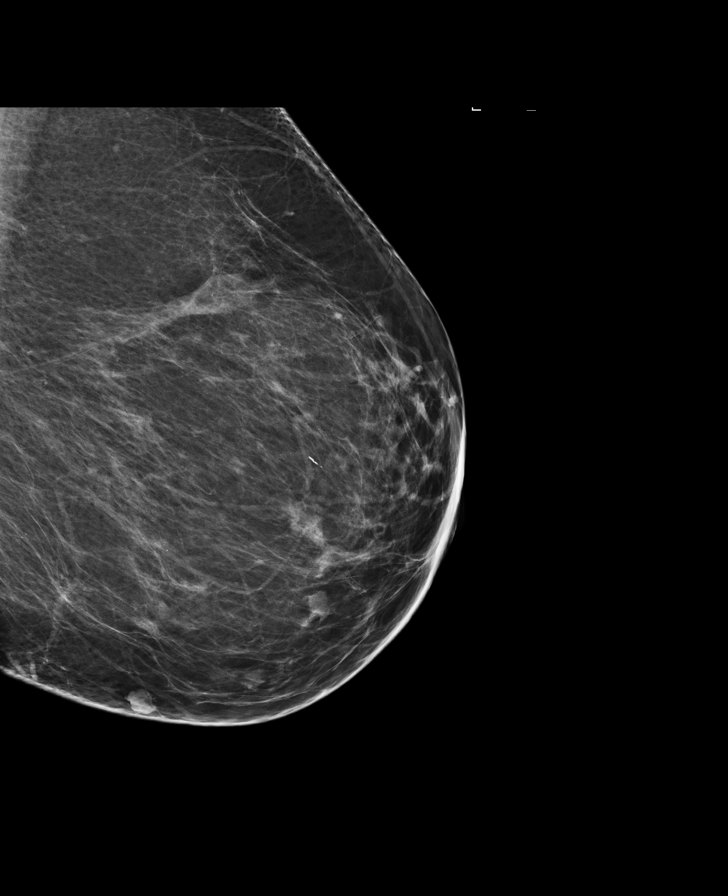

[R CC synth-2D]
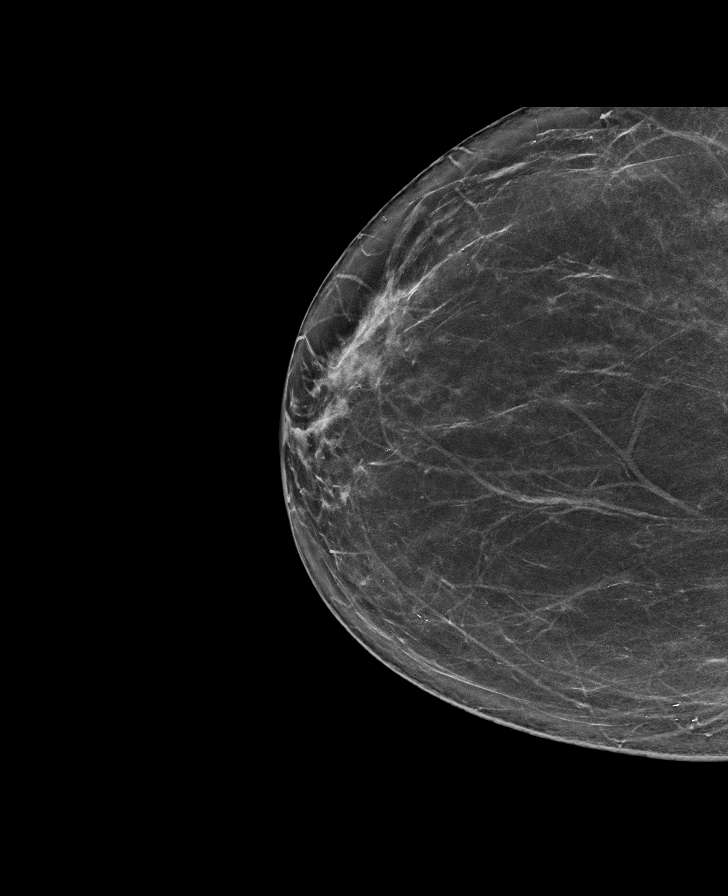

[R CC]
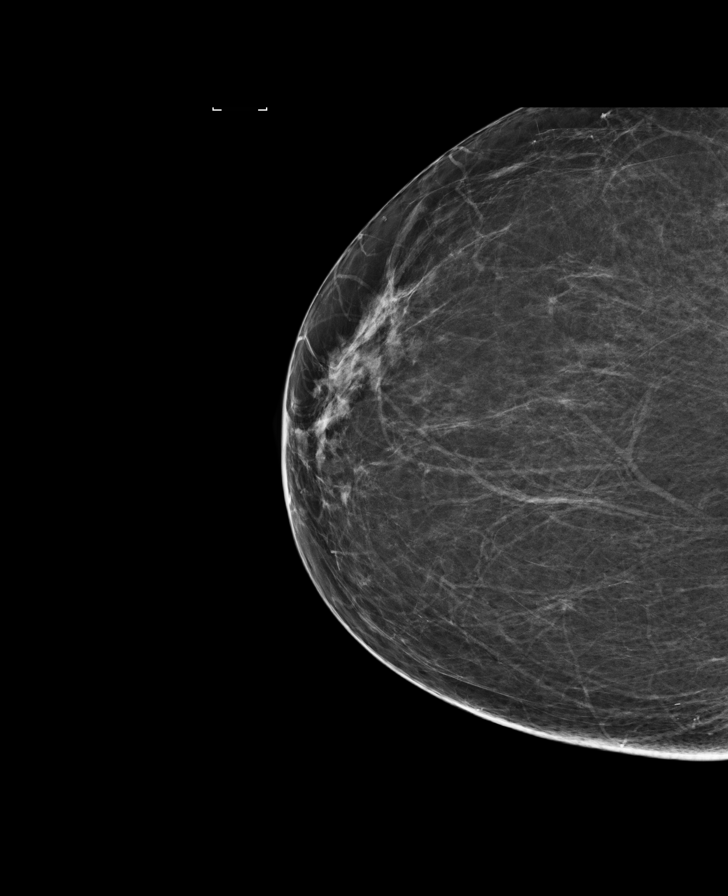

[L MLO synth-2D]
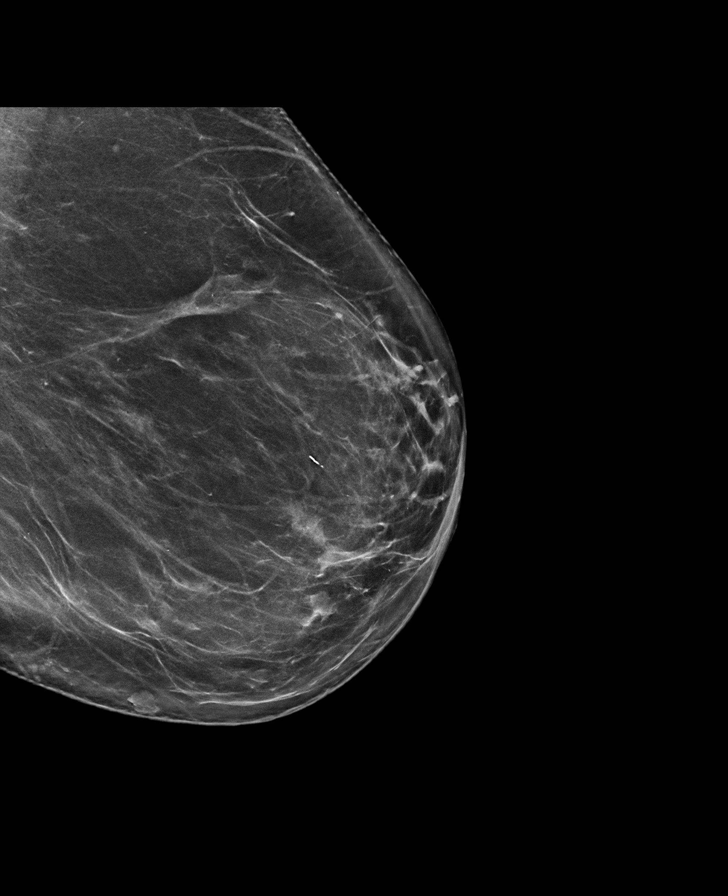

[R MLO synth-2D]
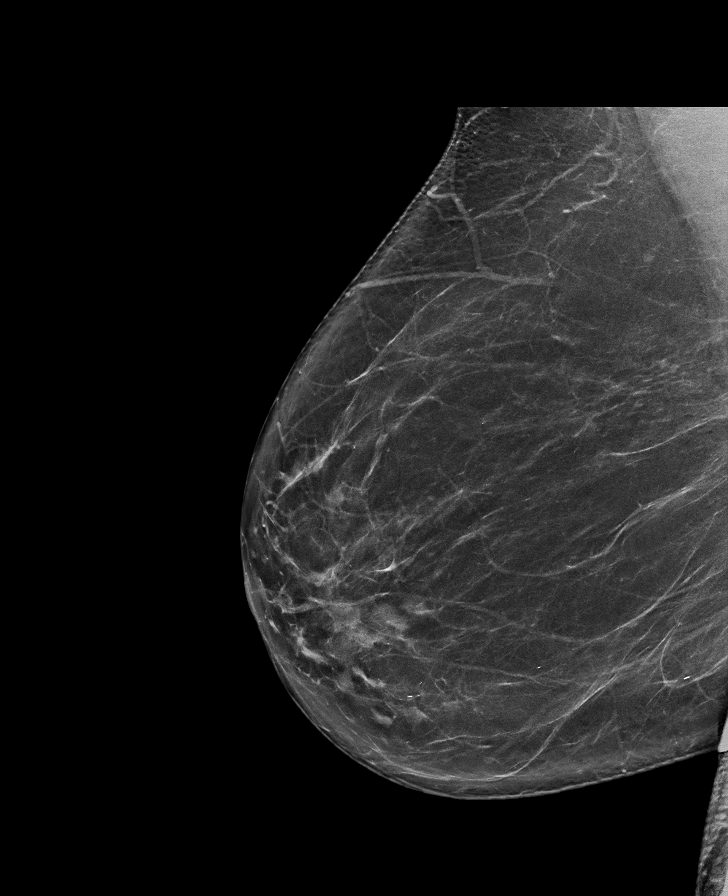

[L CC (2 of 2)]
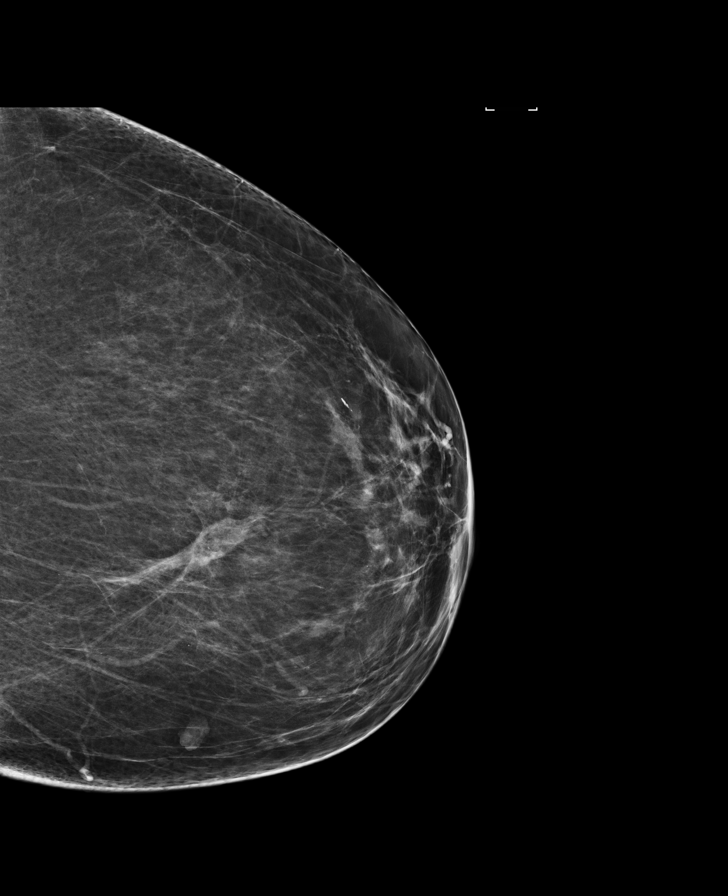

[8 of 29 positions shown; findings below may reference images not displayed]

ACR Breast Density Category b: There are scattered areas of
fibroglandular density.
FINDINGS: Mammographically, there are no suspicious masses or areas of
nonsurgical architectural distortion in the left breast. Stable post
lumpectomy changes. Curvilinear focal asymmetry seen in the slightly
lower right breast, anterior depth, which may represent ductal
structures.

Mammographic images were processed with CAD.

On physical exam, no suspicious masses are palpated.

Targeted right breast ultrasound is performed, showing a mildly
prominent terminal ducts in the subareolar right breast, which
however have symmetric appearance. No intraductal masses. This
finding likely corresponds to the mammographically seen asymmetries.
IMPRESSION: No mammographic evidence of malignancy in either breast, post right
lumpectomy.

RECOMMENDATION:
Diagnostic mammogram is suggested in 1 year. (Code:S3-J-SPH)

I have discussed the findings and recommendations with the patient.
Results were also provided in writing at the conclusion of the
visit. If applicable, a reminder letter will be sent to the patient
regarding the next appointment.

BI-RADS CATEGORY  2: Benign.

## 2017-11-14 ENCOUNTER — Other Ambulatory Visit: Payer: Self-pay

## 2017-11-14 DIAGNOSIS — Z1231 Encounter for screening mammogram for malignant neoplasm of breast: Secondary | ICD-10-CM

## 2017-12-09 ENCOUNTER — Ambulatory Visit: Payer: BC Managed Care – PPO | Admitting: Family Medicine

## 2017-12-09 ENCOUNTER — Encounter: Payer: Self-pay | Admitting: Family Medicine

## 2017-12-09 VITALS — BP 122/68 | HR 73 | Temp 98.2°F | Resp 16 | Ht 63.0 in | Wt 199.5 lb

## 2017-12-09 DIAGNOSIS — E039 Hypothyroidism, unspecified: Secondary | ICD-10-CM

## 2017-12-09 DIAGNOSIS — Z23 Encounter for immunization: Secondary | ICD-10-CM | POA: Diagnosis not present

## 2017-12-09 DIAGNOSIS — E785 Hyperlipidemia, unspecified: Secondary | ICD-10-CM | POA: Diagnosis not present

## 2017-12-09 DIAGNOSIS — E538 Deficiency of other specified B group vitamins: Secondary | ICD-10-CM | POA: Diagnosis not present

## 2017-12-09 DIAGNOSIS — E559 Vitamin D deficiency, unspecified: Secondary | ICD-10-CM

## 2017-12-09 DIAGNOSIS — E669 Obesity, unspecified: Secondary | ICD-10-CM

## 2017-12-09 DIAGNOSIS — R739 Hyperglycemia, unspecified: Secondary | ICD-10-CM

## 2017-12-09 NOTE — Progress Notes (Signed)
Name: Deanna Holder   MRN: [id number]    DOB: 1956-03-25   Date:12/09/2017       Progress Note  Subjective  Chief Complaint  Chief Complaint  Patient presents with  . Follow-up    6 month f/u  . Hypothyroidism    patient takes meds as directed. no issues at the moment  . Allergic Rhinitis     no sx    HPI  History of breast cancer: diagnosed in 2012 no longer seeing h Dr. Jamal Collin, but has mammogram scheduled for next month. Stopped Femara Summer 2017  Hypothyroidism: she was diagnosed with hypothyroidism around 2010, taking levothyroxine 50 mcg daily, she denies dry skin, no constipation.  She is compliant with medication. Unchanged   Obesity:she started an exercise program 05/2015 and wasdown to 184 lbs Summer 2017, she has busy and on vacation, she also states that her elliptical machine broke and has not been as active, but her father is giving his to her now.   Hyperglycemia: she denies polyphagia, polydipsia or polyuria. Gained a little weight, discussed life style modification. We will recheck labs today   Hyperlipidemia: she does not need statin therapy, we will recheck labs, no chest pain or palpitation. Recheck labs today   Patient Active Problem List   Diagnosis Date Noted  . Hyperglycemia 06/13/2015  . Hypothyroidism (acquired) 06/13/2015  . Obesity (BMI 30.0-34.9) 02/07/2015  . Menopause 02/07/2015  . History of breast cancer 01/13/2013  . Allergic rhinitis 10/22/2006    Past Surgical History:  Procedure Laterality Date  . BREAST BIOPSY Left 2012   Positive  . BREAST LUMPECTOMY Left Aug 2012   ER/PR POS,HER 2 NEG  . BREAST SURGERY Left 2012   mammosite balloon placement  . CESAREAN SECTION  1985  . COLONOSCOPY  2007   Dr. Allen Norris  . COLONOSCOPY WITH PROPOFOL N/A 01/18/2016   Procedure: COLONOSCOPY WITH PROPOFOL;  Surgeon: Christene Lye, MD;  Location: [facility] ENDOSCOPY;  Service: Endoscopy;  Laterality: N/A;  . TOOTH EXTRACTION  1970  .  wisdom teeth removal  2008    Family History  Problem Relation Age of Onset  . Thyroid disease Mother   . Heart attack Father   . Hypertension Father   . Breast cancer Paternal Aunt     Social History   Socioeconomic History  . Marital status: Divorced    Spouse name: Not on file  . Number of children: 3  . Years of education: Not on file  . Highest education level: Bachelor's degree (e.g., BA, AB, BS)  Occupational History  . Occupation: Scientist, clinical (histocompatibility and immunogenetics) of court  Social Needs  . Financial resource strain: Not very hard  . Food insecurity:    Worry: Never true    Inability: Never true  . Transportation needs:    Medical: No    Non-medical: No  Tobacco Use  . Smoking status: Never Smoker  . Smokeless tobacco: Never Used  Substance and Sexual Activity  . Alcohol use: No  . Drug use: No  . Sexual activity: Not Currently    Birth control/protection: Abstinence  Lifestyle  . Physical activity:    Days per week: 6 days    Minutes per session: 30 min  . Stress: Not at all  Relationships  . Social connections:    Talks on phone: More than three times a week    Gets together: Twice a week    Attends religious service: More than 4 times per year  Active member of club or organization: Yes    Attends meetings of clubs or organizations: More than 4 times per year    Relationship status: Divorced  . Intimate partner violence:    Fear of current or ex partner: No    Emotionally abused: No    Physically abused: No    Forced sexual activity: No  Other Topics Concern  . Not on file  Social History Narrative   She is divorced, lives alone.    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      Current Outpatient Medications:  .  levothyroxine (SYNTHROID, LEVOTHROID) 50 MCG tablet, TAKE 1 TABLET BY MOUTH EVERY DAY ON AN EMPTY STOMACH, Disp: 90 tablet, Rfl: 1 .  Cyanocobalamin  (B-12-SL) 1000 MCG SUBL, Place 1 tablet (1,000 mcg total) under the tongue daily. (Patient not taking: Reported on 12/09/2017), Disp: 30 tablet, Rfl: 0 .  Vitamin D, Cholecalciferol, 1000 units CAPS, Take 1 capsule by mouth daily. (Patient not taking: Reported on 12/09/2017), Disp: 30 capsule, Rfl: 0  Allergies  Allergen Reactions  . Ampicillin Anaphylaxis  . Vibramycin [Doxycycline Calcium] Anaphylaxis     ROS  Constitutional: Negative for fever, positive  weight change.  Respiratory: Negative for cough and shortness of breath.   Cardiovascular: Negative for chest pain or palpitations.  Gastrointestinal: Negative for abdominal pain, no bowel changes.  Musculoskeletal: Negative for gait problem or joint swelling.  Skin: Negative for rash.  Neurological: Negative for dizziness or headache.  No other specific complaints in a complete review of systems (except as listed in HPI above).  Objective  Vitals:   12/09/17 0822  BP: 122/68  Pulse: 73  Resp: 16  Temp: 98.2 F (36.8 C)  TempSrc: Oral  SpO2: 97%  Weight: 199 lb 8 oz (90.5 kg)  Height: 5\' 3"  (1.6 m)    Body mass index is 35.34 kg/m.  Physical Exam  Constitutional: Patient appears well-developed and well-nourished. Obese  No distress.  HEENT: head atraumatic, normocephalic, pupils equal and reactive to light,  neck supple, throat within normal limits, normal thyroid exam Cardiovascular: Normal rate, regular rhythm and normal heart sounds.  No murmur heard. No BLE edema. Pulmonary/Chest: Effort normal and breath sounds normal. No respiratory distress. Abdominal: Soft.  There is no tenderness. Psychiatric: Patient has a normal mood and affect. behavior is normal. Judgment and thought content normal.  PHQ2/9: Depression screen [facility] 2/9 12/09/2017 06/10/2017 12/06/2016 06/08/2016 05/22/2016  Decreased Interest 0 0 0 0 0  Down, Depressed, Hopeless 0 0 0 0 0  PHQ - 2 Score 0 0 0 0 0     Fall Risk: Fall Risk  12/09/2017  06/10/2017 12/06/2016 06/08/2016 05/22/2016  Falls in the past year? No No No No No     Functional Status Survey: Is the patient deaf or have difficulty hearing?: No Does the patient have difficulty seeing, even when wearing glasses/contacts?: No Does the patient have difficulty concentrating, remembering, or making decisions?: No Does the patient have difficulty walking or climbing stairs?: No Does the patient have difficulty dressing or bathing?: No Does the patient have difficulty doing errands alone such as visiting a doctor's office or shopping?: No    Assessment & Plan  1. Dyslipidemia  - COMPLETE METABOLIC PANEL WITH GFR - Lipid panel  2. Hypothyroidism (acquired)  - TSH  3. Vitamin D  deficiency  Vitamin D level   4. B12 deficiency  - CBC with Differential/Platelet - VITAMIN D 25 Hydroxy (Vit-D Deficiency, Fractures) - Vitamin B12  5. Hyperglycemia  - Hemoglobin A1c  6. Obesity (BMI 30.0-34.9)  She gained some weight, she states getting a new elliptical machine

## 2017-12-10 LAB — CBC WITH DIFFERENTIAL/PLATELET
Basophils Absolute: 21 cells/uL (ref 0–200)
Basophils Relative: 0.4 %
EOS PCT: 2.4 %
Eosinophils Absolute: 127 cells/uL (ref 15–500)
HEMATOCRIT: 40.1 % (ref 35.0–45.0)
HEMOGLOBIN: 13.4 g/dL (ref 11.7–15.5)
LYMPHS ABS: 1505 {cells}/uL (ref 850–3900)
MCH: 29.8 pg (ref 27.0–33.0)
MCHC: 33.4 g/dL (ref 32.0–36.0)
MCV: 89.1 fL (ref 80.0–100.0)
MONOS PCT: 8.3 %
MPV: 10.3 fL (ref 7.5–12.5)
NEUTROS ABS: 3207 {cells}/uL (ref 1500–7800)
NEUTROS PCT: 60.5 %
Platelets: 257 10*3/uL (ref 140–400)
RBC: 4.5 10*6/uL (ref 3.80–5.10)
RDW: 12.1 % (ref 11.0–15.0)
Total Lymphocyte: 28.4 %
WBC mixed population: 440 cells/uL (ref 200–950)
WBC: 5.3 10*3/uL (ref 3.8–10.8)

## 2017-12-10 LAB — COMPLETE METABOLIC PANEL WITH GFR
AG RATIO: 1.6 (calc) (ref 1.0–2.5)
ALBUMIN MSPROF: 4.3 g/dL (ref 3.6–5.1)
ALKALINE PHOSPHATASE (APISO): 87 U/L (ref 33–130)
ALT: 16 U/L (ref 6–29)
AST: 14 U/L (ref 10–35)
BUN: 16 mg/dL (ref 7–25)
CO2: 30 mmol/L (ref 20–32)
CREATININE: 0.81 mg/dL (ref 0.50–0.99)
Calcium: 9.5 mg/dL (ref 8.6–10.4)
Chloride: 104 mmol/L (ref 98–110)
GFR, Est African American: 90 mL/min/{1.73_m2} (ref >=60)
GFR, Est Non African American: 78 mL/min/{1.73_m2} (ref >=60)
Globulin: 2.7 g/dL (calc) (ref 1.9–3.7)
Glucose, Bld: 98 mg/dL (ref 65–99)
POTASSIUM: 4.1 mmol/L (ref 3.5–5.3)
Sodium: 140 mmol/L (ref 135–146)
Total Bilirubin: 0.5 mg/dL (ref 0.2–1.2)
Total Protein: 7 g/dL (ref 6.1–8.1)

## 2017-12-10 LAB — TSH: TSH: 2.03 mIU/L (ref 0.40–4.50)

## 2017-12-10 LAB — LIPID PANEL
CHOL/HDL RATIO: 3.9 (calc) (ref <5.0)
CHOLESTEROL: 227 mg/dL — AB (ref <200)
HDL: 58 mg/dL (ref >50)
LDL Cholesterol (Calc): 146 mg/dL (calc) — ABNORMAL HIGH
NON-HDL CHOLESTEROL (CALC): 169 mg/dL — AB (ref <130)
Triglycerides: 114 mg/dL (ref <150)

## 2017-12-10 LAB — HEMOGLOBIN A1C
HEMOGLOBIN A1C: 5.9 %{Hb} — AB (ref <5.7)
Mean Plasma Glucose: 123 (calc)
eAG (mmol/L): 6.8 (calc)

## 2017-12-10 LAB — VITAMIN B12: VITAMIN B 12: 344 pg/mL (ref 200–1100)

## 2017-12-10 LAB — VITAMIN D 25 HYDROXY (VIT D DEFICIENCY, FRACTURES): VIT D 25 HYDROXY: 20 ng/mL — AB (ref 30–100)

## 2018-01-07 ENCOUNTER — Other Ambulatory Visit: Payer: Self-pay | Admitting: Family Medicine

## 2018-01-07 DIAGNOSIS — E039 Hypothyroidism, unspecified: Secondary | ICD-10-CM

## 2018-01-08 ENCOUNTER — Encounter: Payer: Self-pay | Admitting: Family Medicine

## 2018-01-08 DIAGNOSIS — E559 Vitamin D deficiency, unspecified: Secondary | ICD-10-CM | POA: Insufficient documentation

## 2018-01-08 DIAGNOSIS — E538 Deficiency of other specified B group vitamins: Secondary | ICD-10-CM | POA: Insufficient documentation

## 2018-01-08 DIAGNOSIS — E785 Hyperlipidemia, unspecified: Secondary | ICD-10-CM | POA: Insufficient documentation

## 2018-01-15 ENCOUNTER — Ambulatory Visit
Admission: RE | Admit: 2018-01-15 | Discharge: 2018-01-15 | Disposition: A | Discharge status: Home / Self Care | Payer: BC Managed Care – PPO | Source: Ambulatory Visit | Attending: General Surgery | Admitting: General Surgery

## 2018-01-15 DIAGNOSIS — Z1231 Encounter for screening mammogram for malignant neoplasm of breast: Secondary | ICD-10-CM

## 2018-01-21 ENCOUNTER — Encounter: Payer: Self-pay | Admitting: General Surgery

## 2018-01-21 ENCOUNTER — Ambulatory Visit: Payer: BC Managed Care – PPO | Admitting: General Surgery

## 2018-01-21 VITALS — BP 132/72 | HR 78 | Resp 12 | Ht 63.0 in | Wt 200.0 lb

## 2018-01-21 DIAGNOSIS — Z853 Personal history of malignant neoplasm of breast: Secondary | ICD-10-CM | POA: Diagnosis not present

## 2018-01-21 NOTE — Patient Instructions (Addendum)
The patient is aware to call back for any questions or new concerns.  May applied skin cream or Vaseline to the left breast crusted area.  Patient will be asked to return to the office in one year with a bilateral screening mammogram.

## 2018-01-21 NOTE — Progress Notes (Signed)
Patient ID: Deanna Holder, female   DOB: 03-03-1956, 61 y.o.   MRN: [id number]  Chief Complaint  Patient presents with  . Follow-up    mammogram    HPI Deanna Holder is a 62 y.o. female who presents for a left breast cancer and breast evaluation, former patient of Dr Jamal Collin. The most recent mammogram was done on 01/15/2018.  Patient does perform regular self breast checks and gets regular mammograms done.   She states she has a crusted area on her left breast that she noticed about 6 months ago. She states it has not changed in size but that the crust comes off then reforms.  HPI  Past Medical History:  Diagnosis Date  . Cancer ([place]) 2012   left breast/ pT1b N0  . Diffuse cystic mastopathy   . Hypothyroidism   . Personal history of malignant neoplasm of breast 2012  . Personal history of radiation therapy   . Thyroid disease 2010    Past Surgical History:  Procedure Laterality Date  . 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 BREAST LUMPECTOMY Left Aug 2012   ER/PR POS,HER 2 NEG  . BREAST SURGERY Left 2012   mammosite balloon placement  . CESAREAN SECTION  1985  . COLONOSCOPY  2007   Dr. Allen Norris  . COLONOSCOPY WITH PROPOFOL N/A 01/18/2016   Procedure: COLONOSCOPY WITH PROPOFOL;  Surgeon: Christene Lye, MD;  Location: [facility] ENDOSCOPY;  Service: Endoscopy;  Laterality: N/A;  . TOOTH EXTRACTION  1970  . wisdom teeth removal  2008    Family History  Problem Relation Age of Onset  . Thyroid disease Mother   . Heart attack Father   . Hypertension Father   . Breast cancer Paternal Aunt     Social History Social History   Tobacco Use  . Smoking status: Never Smoker  . Smokeless tobacco: Never Used  Substance Use Topics  . Alcohol use: No  . Drug use: No    Allergies  Allergen Reactions  . Ampicillin Anaphylaxis  . Vibramycin [Doxycycline Calcium] Anaphylaxis    Current Outpatient Medications  Medication Sig Dispense Refill  .  Cyanocobalamin (VITAMIN B 12 PO) Take by mouth daily.    Marland Kitchen levothyroxine (SYNTHROID, LEVOTHROID) 50 MCG tablet TAKE 1 TABLET BY MOUTH EVERY DAY ON AN EMPTY STOMACH 90 tablet 1  . Vitamin D, Cholecalciferol, 1000 units CAPS Take 1 capsule by mouth daily. 30 capsule 0   No current facility-administered medications for this visit.     Review of Systems Review of Systems  Constitutional: Negative.   Respiratory: Negative.   Cardiovascular: Negative.     Blood pressure 132/72, pulse 78, resp. rate 12, height 5\' 3"  (1.6 m), weight 200 lb (90.7 kg), SpO2 96 %.  Physical Exam Physical Exam  Constitutional: She is oriented to person, place, and time. She appears well-developed and well-nourished.  HENT:  Mouth/Throat: Oropharynx is clear and moist.  Eyes: Conjunctivae are normal. No scleral icterus.  Neck: Neck supple.  Cardiovascular: Normal rate, regular rhythm and normal heart sounds.  Pulmonary/Chest: Effort normal and breath sounds normal. Right breast exhibits no inverted nipple, no mass, no nipple discharge, no skin change and no tenderness. Left breast exhibits no inverted nipple, no mass, no nipple discharge, no skin change and no tenderness.  Multiple hemangiomas.  2 mm area of scabbing left breast.    Lymphadenopathy:    She has no cervical adenopathy.    She has no axillary  adenopathy.  Neurological: She is alert and oriented to person, place, and time.  Skin: Skin is warm and dry.  Psychiatric: Her behavior is normal.    Data Reviewed Bilateral screening mammograms dated September 15, 2017 reviewed.  Postsurgical changes.  BI-RADS-1.  Area in the right retroareolar area less prominent than in 2018.  Assessment    No evidence of recurrent cancer.  Radiation changes to the skin with focal area of skin ulceration, clinical impression negative for recurrent malignancy.    Plan  Patient will be asked to return to the office in one year with a bilateral screening  mammogram. She has been asked to apply  skin cream, triple antibiotic ointment or Vaseline to the left breast crusted area to keep the area moist.  If she notices increasing bleeding or enlargement of the ulcerated area she is been asked to call for early reassessment.  HPI, Physical Exam, Assessment and Plan have been scribed under the direction and in the presence of Robert Bellow, MD. Karie Fetch, RN  I have completed the exam and reviewed the above documentation for accuracy and completeness.  I agree with the above.  Haematologist has been used and any errors in dictation or transcription are unintentional.  Hervey Ard, M.D., F.A.C.S.  Forest Gleason Byrnett 01/21/2018, 8:21 PM

## 2018-03-11 ENCOUNTER — Ambulatory Visit (INDEPENDENT_AMBULATORY_CARE_PROVIDER_SITE_OTHER): Payer: BC Managed Care – PPO

## 2018-03-11 DIAGNOSIS — Z23 Encounter for immunization: Secondary | ICD-10-CM

## 2018-06-13 ENCOUNTER — Ambulatory Visit (INDEPENDENT_AMBULATORY_CARE_PROVIDER_SITE_OTHER): Payer: BC Managed Care – PPO | Admitting: Family Medicine

## 2018-06-13 ENCOUNTER — Encounter: Payer: Self-pay | Admitting: Family Medicine

## 2018-06-13 VITALS — BP 120/80 | HR 75 | Temp 98.1°F | Resp 16 | Ht 63.0 in | Wt 196.0 lb

## 2018-06-13 DIAGNOSIS — Z780 Asymptomatic menopausal state: Secondary | ICD-10-CM

## 2018-06-13 DIAGNOSIS — E2839 Other primary ovarian failure: Secondary | ICD-10-CM

## 2018-06-13 DIAGNOSIS — Z01419 Encounter for gynecological examination (general) (routine) without abnormal findings: Secondary | ICD-10-CM

## 2018-06-13 DIAGNOSIS — E039 Hypothyroidism, unspecified: Secondary | ICD-10-CM

## 2018-06-13 DIAGNOSIS — R739 Hyperglycemia, unspecified: Secondary | ICD-10-CM

## 2018-06-13 DIAGNOSIS — E785 Hyperlipidemia, unspecified: Secondary | ICD-10-CM

## 2018-06-13 DIAGNOSIS — E669 Obesity, unspecified: Secondary | ICD-10-CM

## 2018-06-13 DIAGNOSIS — E559 Vitamin D deficiency, unspecified: Secondary | ICD-10-CM

## 2018-06-13 DIAGNOSIS — E538 Deficiency of other specified B group vitamins: Secondary | ICD-10-CM

## 2018-06-13 DIAGNOSIS — Z23 Encounter for immunization: Secondary | ICD-10-CM

## 2018-06-13 DIAGNOSIS — M810 Age-related osteoporosis without current pathological fracture: Secondary | ICD-10-CM

## 2018-06-13 MED ORDER — VITAMIN D 50 MCG (2000 UT) PO CAPS: 1.0000 | ORAL_CAPSULE | Freq: Every day | ORAL | 0 refills | Status: DC

## 2018-06-13 MED ORDER — B-12 1000 MCG SL SUBL: 1.0000 | SUBLINGUAL_TABLET | Freq: Every day | SUBLINGUAL | 5 refills | Status: AC

## 2018-06-13 MED ORDER — LEVOTHYROXINE SODIUM 50 MCG PO TABS: ORAL_TABLET | ORAL | 1 refills | Status: DC

## 2018-06-13 NOTE — Progress Notes (Signed)
Name: Deanna Holder   MRN: [id number]    DOB: 06/12/1955   Date:06/13/2018       Progress Note  Subjective  Chief Complaint  Chief Complaint  Patient presents with  . Annual Exam  . Follow-up    HPI   Patient presents for annual CPE and regular follow up.  History of breast cancer: diagnosed in 2012 currently under the care of Dr. Fleet Contras for yearly follow ups. Stopped Femara Summer 2017. She denies any breast lumps or nipple discharge   Hypothyroidism: she was diagnosed with hypothyroidism around 2010, taking levothyroxine 50 mcg daily, she denies dry skin, no constipation. TSH has been normal for years, recheck yearly   Obesity:she is walking 50 minutes 5 days a week, eating healthier, gets [employer] at home   Hyperglycemia: she denies polyphagia, polydipsia or polyuria.We will recheck labs in 6 months  Hyperlipidemia: she does not need statin therapy, we will recheck labs, no chest pain or palpitation The 10-year ASCVD risk score Mikey Bussing DC Jr., et al., 2013) is: 4.2%   Values used to calculate the score:     Age: 63 years     Sex: Female     Is Non-Hispanic African American: No     Diabetic: No     Tobacco smoker: No     Systolic Blood Pressure: 546 mmHg     Is BP treated: No     HDL Cholesterol: 58 mg/dL     Total Cholesterol: 227 mg/dL  Diet: eating healthy  Exercise: she is physically active   USPSTF grade A and B recommendations    Office Visit from 06/13/2018 in [facility]  AUDIT-C Score  0     Depression:  Depression screen [facility] 2/9 06/13/2018 12/09/2017 06/10/2017 12/06/2016 06/08/2016  Decreased Interest 0 0 0 0 0  Down, Depressed, Hopeless 0 0 0 0 0  PHQ - 2 Score 0 0 0 0 0   Hypertension: BP Readings from Last 3 Encounters:  06/13/18 120/80  01/21/18 132/72  12/09/17 122/68   Obesity: Wt Readings from Last 3 Encounters:  06/13/18 196 lb (88.9 kg)  01/21/18 200 lb (90.7 kg)  12/09/17 199 lb 8 oz (90.5 kg)   BMI  Readings from Last 3 Encounters:  06/13/18 34.72 kg/m  01/21/18 35.43 kg/m  12/09/17 35.34 kg/m    Hep C Screening: up to date  STD testing and prevention (HIV/chl/gon/syphilis): N/A Intimate partner violence: negative screen  Sexual History/Pain during Intercourse: not currently  Menstrual History/LMP/Abnormal Bleeding: post-menopausal  Incontinence Symptoms: no   Advanced Care Planning: A voluntary discussion about advance care planning including the explanation and discussion of advance directives.  Discussed health care proxy and Living will, and the patient was able to identify a health care proxy as youngest son .  Patient does not have a living will at present time.  Breast cancer:  HM Mammogram  Date Value Ref Range Status  12/30/2012 Self Reported Normal 0-4 Bi-Rad, Self Reported Normal Final    Comment:    History of Breast Cancer    BRCA gene screening: N/A Cervical cancer screening: up to date   Osteoporosis Screening:  HM Dexa Scan  Date Value Ref Range Status  12/24/2013 Major Osteoporotic Fracture  Final    Lipids:  Lab Results  Component Value Date   CHOL 227 (H) 12/09/2017   CHOL 202 (H) 12/06/2016   CHOL 213 (H) 12/06/2015   Lab Results  Component Value Date  HDL 58 12/09/2017   HDL 61 12/06/2016   HDL 55 12/06/2015   Lab Results  Component Value Date   LDLCALC 146 (H) 12/09/2017   LDLCALC 123 (H) 12/06/2016   LDLCALC 137 (H) 12/06/2015   Lab Results  Component Value Date   TRIG 114 12/09/2017   TRIG 88 12/06/2016   TRIG 107 12/06/2015   Lab Results  Component Value Date   CHOLHDL 3.9 12/09/2017   CHOLHDL 3.3 12/06/2016   CHOLHDL 3.9 12/06/2015   No results found for: LDLDIRECT  Glucose:  Glucose  Date Value Ref Range Status  02/04/2014 100 (H) 65 - 99 mg/dL Final  08/06/2013 101 (H) 65 - 99 mg/dL Final  01/26/2013 105 (H) 65 - 99 mg/dL Final   Glucose, Bld  Date Value Ref Range Status  12/09/2017 98 65 - 99 mg/dL Final     Comment:    .            Fasting reference interval .   12/06/2016 97 65 - 99 mg/dL Final  06/08/2016 96 65 - 99 mg/dL Final    Skin cancer: discussed atypical lesions, she sees Dr. Phillip Heal  Colorectal cancer: up to date  Lung cancer:   Low Dose CT Chest recommended if Age 64-80 years, 30 pack-year currently smoking OR have quit w/in 63years. Patient does not qualify.   ECG:2010  Patient Active Problem List   Diagnosis Date Noted  . Vitamin D deficiency 01/08/2018  . Low vitamin B12 level 01/08/2018  . Dyslipidemia 01/08/2018  . Hyperglycemia 06/13/2015  . Hypothyroidism (acquired) 06/13/2015  . Obesity (BMI 30.0-34.9) 02/07/2015  . Menopause 02/07/2015  . History of breast cancer 01/13/2013  . Allergic rhinitis 10/22/2006    Past Surgical History:  Procedure Laterality Date  . 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 BREAST LUMPECTOMY Left Aug 2012   ER/PR POS,HER 2 NEG  . BREAST SURGERY Left 2012   mammosite balloon placement  . CESAREAN SECTION  1985  . COLONOSCOPY  2007   Dr. Allen Norris  . COLONOSCOPY WITH PROPOFOL N/A 01/18/2016   Procedure: COLONOSCOPY WITH PROPOFOL;  Surgeon: Christene Lye, MD;  Location: [facility] ENDOSCOPY;  Service: Endoscopy;  Laterality: N/A;  . TOOTH EXTRACTION  1970  . wisdom teeth removal  2008    Family History  Problem Relation Age of Onset  . Thyroid disease Mother   . Osteoporosis Mother   . Heart attack Father   . Hypertension Father   . Heart disease Father   . Breast cancer Paternal Aunt     Social History   Socioeconomic History  . Marital status: Divorced    Spouse name: Not on file  . Number of children: 3  . Years of education: Not on file  . Highest education level: Bachelor's degree (e.g., BA, AB, BS)  Occupational History  . Occupation: Scientist, clinical (histocompatibility and immunogenetics) of court  Social Needs  . Financial resource strain: Not very hard  . Food insecurity:    Worry: Never true    Inability: Never true  . Transportation  needs:    Medical: No    Non-medical: No  Tobacco Use  . Smoking status: Never Smoker  . Smokeless tobacco: Never Used  Substance and Sexual Activity  . Alcohol use: No  . Drug use: No  . Sexual activity: Not Currently    Birth control/protection: Abstinence  Lifestyle  . Physical activity:    Days per week: 6 days    Minutes  per session: 30 min  . Stress: Not at all  Relationships  . Social connections:    Talks on phone: More than three times a week    Gets together: Twice a week    Attends religious service: More than 4 times per year    Active member of club or organization: Yes    Attends meetings of clubs or organizations: More than 4 times per year    Relationship status: Divorced  . Intimate partner violence:    Fear of current or ex partner: No    Emotionally abused: No    Physically abused: No    Forced sexual activity: No  Other Topics Concern  . Not on file  Social History Narrative   She is divorced, lives alone.    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      Current Outpatient Medications:  .  levothyroxine (SYNTHROID, LEVOTHROID) 50 MCG tablet, Take one daily, Disp: 90 tablet, Rfl: 1 .  Multiple Vitamins-Minerals (MULTIVITAMIN ADULT PO), Take by mouth., Disp: , Rfl:  .  Cholecalciferol (VITAMIN D) 50 MCG (2000 UT) CAPS, Take 1 capsule (2,000 Units total) by mouth daily., Disp: 30 capsule, Rfl: 0 .  Cyanocobalamin (B-12) 1000 MCG SUBL, Place 1 tablet under the tongue daily., Disp: 30 each, Rfl: 5  Allergies  Allergen Reactions  . Ampicillin Anaphylaxis  . Vibramycin [Doxycycline Calcium] Anaphylaxis     ROS  Constitutional: Negative for fever or weight change.  Respiratory: Negative for cough and shortness of breath.   Cardiovascular: Negative for chest pain or palpitations.  Gastrointestinal: Negative for abdominal pain, no bowel changes.   Musculoskeletal: Negative for gait problem or joint swelling.  Skin: Negative for rash.  Neurological: Negative for dizziness or headache.  No other specific complaints in a complete review of systems (except as listed in HPI above).  Objective  Vitals:   06/13/18 0821  BP: 120/80  Pulse: 75  Resp: 16  Temp: 98.1 F (36.7 C)  TempSrc: Oral  SpO2: 96%  Weight: 196 lb (88.9 kg)  Height: _0  (1.6 m)    Body mass index is 34.72 kg/m.  Physical Exam  Constitutional: Patient appears well-developed and well-nourished. No distress.  HENT: Head: Normocephalic and atraumatic. Ears: B TMs ok, no erythema or effusion; Nose: Nose normal. Mouth/Throat: Oropharynx is clear and moist. No oropharyngeal exudate.  Eyes: Conjunctivae and EOM are normal. Pupils are equal, round, and reactive to light. No scleral icterus.  Neck: Normal range of motion. Neck supple. No JVD present. No thyromegaly present.  Cardiovascular: Normal rate, regular rhythm and normal heart sounds.  No murmur heard. No BLE edema. Pulmonary/Chest: Effort normal and breath sounds normal. No respiratory distress. Abdominal: Soft. Bowel sounds are normal, no distension. There is no tenderness. no masses Breast: no lumps or masses, no nipple discharge or rashes FEMALE GENITALIA:  Not done RECTAL: not done Musculoskeletal: Normal range of motion, no joint effusions. No gross deformities Neurological: he is alert and oriented to person, place, and time. No cranial nerve deficit. Coordination, balance, strength, speech and gait are normal.  Skin: Skin is warm and dry. No rash noted. No erythema.  Psychiatric: Patient has a normal mood and affect. behavior is normal. Judgment and thought content normal.   PHQ2/9: Depression screen [facility] 2/9 06/13/2018 12/09/2017 06/10/2017 12/06/2016 06/08/2016  Decreased Interest 0  0 0 0 0  Down, Depressed, Hopeless 0 0 0 0 0  PHQ - 2 Score 0 0 0 0 0     Fall Risk: Fall Risk  06/13/2018  12/09/2017 06/10/2017 12/06/2016 06/08/2016  Falls in the past year? 0 No No No No  Number falls in past yr: 0 - - - -  Injury with Fall? 0 - - - -    Assessment & Plan  1. Well woman exam   2. Obesity (BMI 30.0-34.9)  Discussed with the patient the risk posed by an increased BMI. Discussed importance of portion control, calorie counting and at least 150 minutes of physical activity weekly. Avoid sweet beverages and drink more water. Eat at least 6 servings of fruit and vegetables daily   3. Hypothyroidism (acquired)  Doing well with levothyroxine  4. Hyperglycemia  Recheck labs next visit   5. Dyslipidemia  On life style modification   6. B12 deficiency  Needs to resume B12 SL  7. Need for Tdap vaccination  - Tdap vaccine greater than or equal to 7yo IM  8. Vitamin D deficiency  Resume supplementation   - Cholecalciferol (VITAMIN D) 50 MCG (2000 UT) CAPS; Take 1 capsule (2,000 Units total) by mouth daily.  Dispense: 30 capsule; Refill: 0  9. Ovarian failure  - DG Bone Density; Future  10. Osteopenia after menopause  - DG Bone Density; Future  -USPSTF grade A and B recommendations reviewed with patient; age-appropriate recommendations, preventive care, screening tests, etc discussed and encouraged; healthy living encouraged; see AVS for patient education given to patient -Discussed importance of 150 minutes of physical activity weekly, eat two servings of fish weekly, eat one serving of tree nuts ( cashews, pistachios, pecans, almonds.Marland Kitchen) every other day, eat 6 servings of fruit/vegetables daily and drink plenty of water and avoid sweet beverages.

## 2018-06-13 NOTE — Patient Instructions (Signed)
Preventive Care 40-64 Years, Female Preventive care refers to lifestyle choices and visits with your health care provider that can promote health and wellness. What does preventive care include?   A yearly physical exam. This is also called an annual well check.  Dental exams once or twice a year.  Routine eye exams. Ask your health care provider how often you should have your eyes checked.  Personal lifestyle choices, including: ? Daily care of your teeth and gums. ? Regular physical activity. ? Eating a healthy diet. ? Avoiding tobacco and drug use. ? Limiting alcohol use. ? Practicing safe sex. ? Taking low-dose aspirin daily starting at age 50. ? Taking vitamin and mineral supplements as recommended by your health care provider. What happens during an annual well check? The services and screenings done by your health care provider during your annual well check will depend on your age, overall health, lifestyle risk factors, and family history of disease. Counseling Your health care provider may ask you questions about your:  Alcohol use.  Tobacco use.  Drug use.  Emotional well-being.  Home and relationship well-being.  Sexual activity.  Eating habits.  Work and work environment.  Method of birth control.  Menstrual cycle.  Pregnancy history. Screening You may have the following tests or measurements:  Height, weight, and BMI.  Blood pressure.  Lipid and cholesterol levels. These may be checked every 5 years, or more frequently if you are over 50 years old.  Skin check.  Lung cancer screening. You may have this screening every year starting at age 55 if you have a 30-pack-year history of smoking and currently smoke or have quit within the past 15 years.  Colorectal cancer screening. All adults should have this screening starting at age 50 and continuing until age 75. Your health care provider may recommend screening at age 45. You will have tests every  1-10 years, depending on your results and the type of screening test. People at increased risk should start screening at an earlier age. Screening tests may include: ? Guaiac-based fecal occult blood testing. ? Fecal immunochemical test (FIT). ? Stool DNA test. ? Virtual colonoscopy. ? Sigmoidoscopy. During this test, a flexible tube with a tiny camera (sigmoidoscope) is used to examine your rectum and lower colon. The sigmoidoscope is inserted through your anus into your rectum and lower colon. ? Colonoscopy. During this test, a long, thin, flexible tube with a tiny camera (colonoscope) is used to examine your entire colon and rectum.  Hepatitis C blood test.  Hepatitis B blood test.  Sexually transmitted disease (STD) testing.  Diabetes screening. This is done by checking your blood sugar (glucose) after you have not eaten for a while (fasting). You may have this done every 1-3 years.  Mammogram. This may be done every 1-2 years. Talk to your health care provider about when you should start having regular mammograms. This may depend on whether you have a family history of breast cancer.  BRCA-related cancer screening. This may be done if you have a family history of breast, ovarian, tubal, or peritoneal cancers.  Pelvic exam and Pap test. This may be done every 3 years starting at age 21. Starting at age 30, this may be done every 5 years if you have a Pap test in combination with an HPV test.  Bone density scan. This is done to screen for osteoporosis. You may have this scan if you are at high risk for osteoporosis. Discuss your test results, treatment options,   and if necessary, the need for more tests with your health care provider. Vaccines Your health care provider may recommend certain vaccines, such as:  Influenza vaccine. This is recommended every year.  Tetanus, diphtheria, and acellular pertussis (Tdap, Td) vaccine. You may need a Td booster every 10 years.  Varicella  vaccine. You may need this if you have not been vaccinated.  Zoster vaccine. You may need this after age 38.  Measles, mumps, and rubella (MMR) vaccine. You may need at least one dose of MMR if you were born in 1957 or later. You may also need a second dose.  Pneumococcal 13-valent conjugate (PCV13) vaccine. You may need this if you have certain conditions and were not previously vaccinated.  Pneumococcal polysaccharide (PPSV23) vaccine. You may need one or two doses if you smoke cigarettes or if you have certain conditions.  Meningococcal vaccine. You may need this if you have certain conditions.  Hepatitis A vaccine. You may need this if you have certain conditions or if you travel or work in places where you may be exposed to hepatitis A.  Hepatitis B vaccine. You may need this if you have certain conditions or if you travel or work in places where you may be exposed to hepatitis B.  Haemophilus influenzae type b (Hib) vaccine. You may need this if you have certain conditions. Talk to your health care provider about which screenings and vaccines you need and how often you need them. This information is not intended to replace advice given to you by your health care provider. Make sure you discuss any questions you have with your health care provider. Document Released: 06/03/2015 Document Revised: 06/27/2017 Document Reviewed: 03/08/2015 Elsevier Interactive Patient Education  2019 [employer].

## 2018-07-02 ENCOUNTER — Other Ambulatory Visit: Payer: Self-pay | Admitting: Family Medicine

## 2018-07-02 DIAGNOSIS — E039 Hypothyroidism, unspecified: Secondary | ICD-10-CM

## 2018-07-17 ENCOUNTER — Ambulatory Visit
Admission: RE | Admit: 2018-07-17 | Discharge: 2018-07-17 | Disposition: A | Discharge status: Home / Self Care | Payer: BC Managed Care – PPO | Attending: Family Medicine | Admitting: Family Medicine

## 2018-07-17 ENCOUNTER — Ambulatory Visit: Payer: BC Managed Care – PPO | Admitting: Family Medicine

## 2018-07-17 ENCOUNTER — Ambulatory Visit
Admission: RE | Admit: 2018-07-17 | Discharge: 2018-07-17 | Disposition: A | Discharge status: Home / Self Care | Payer: BC Managed Care – PPO | Source: Ambulatory Visit | Attending: Family Medicine | Admitting: Family Medicine

## 2018-07-17 ENCOUNTER — Encounter: Payer: Self-pay | Admitting: Family Medicine

## 2018-07-17 VITALS — BP 120/78 | HR 77 | Temp 98.6°F | Resp 16 | Ht 63.0 in | Wt 199.4 lb

## 2018-07-17 DIAGNOSIS — M79671 Pain in right foot: Secondary | ICD-10-CM

## 2018-07-17 NOTE — Progress Notes (Signed)
BP 120/78 (BP Location: Right Arm, Patient Position: Sitting, Cuff Size: Normal)   Pulse 77   Temp 98.6 F (37 C) (Oral)   Resp 16   Ht 5\' 3"  (1.6 m)   Wt 199 lb 6.4 oz (90.4 kg)   LMP  (LMP Unknown)   SpO2 96%   BMI 35.32 kg/m    Subjective:    Patient ID: Deanna Holder, female    DOB: October 25, 1955, 63 y.o.   MRN: [id number]  HPI: Deanna Holder is a 63 y.o. female  Chief Complaint  Patient presents with  . Foot Pain    right heel pain for 2 days    HPI Here for acute visit Right heel pain, pain bottom of the foot Started at work; then son came over, installed new printer, lots of little things, then finally able to relax and realized how much it hurt Tuesday started, Tues and Wed night pretty bad Tried to stay off of it Walking on the ball of her right foot She changed to a different pair of shoes on Wed and a different pair Thursday No injuries Normal activities No hx of gout No hx of osteopenia or osteoporosis No hx of diabetes No recent fluroquinolones  Walking 3 out of 10 In bed is 6 out of 10   Depression screen [facility] 2/9 07/17/2018 07/17/2018 06/13/2018 12/09/2017 06/10/2017  Decreased Interest 0 0 0 0 0  Down, Depressed, Hopeless 0 0 0 0 0  PHQ - 2 Score 0 0 0 0 0  Altered sleeping 0 0 - - -  Tired, decreased energy 0 0 - - -  Change in appetite 0 0 - - -  Feeling bad or failure about yourself  0 0 - - -  Trouble concentrating 0 0 - - -  Moving slowly or fidgety/restless 0 0 - - -  Suicidal thoughts 0 0 - - -  PHQ-9 Score 0 0 - - -  Difficult doing work/chores Not difficult at all Not difficult at all - - -  MD note: no depression  Fall Risk  07/17/2018 06/13/2018 12/09/2017 06/10/2017 12/06/2016  Falls in the past year? 0 0 No No No  Number falls in past yr: 0 0 - - -  Injury with Fall? 0 0 - - -  Follow up Falls evaluation completed - - - -    Relevant past medical, surgical, family and social history reviewed Past Medical History:    Diagnosis Date  . Cancer ([place]) 2012   left breast/ pT1b N0  . Diffuse cystic mastopathy   . Hypothyroidism   . Personal history of malignant neoplasm of breast 2012  . Personal history of radiation therapy   . Thyroid disease 2010   Past Surgical History:  Procedure Laterality Date  . 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 BREAST LUMPECTOMY Left Aug 2012   ER/PR POS,HER 2 NEG  . BREAST SURGERY Left 2012   mammosite balloon placement  . CESAREAN SECTION  1985  . COLONOSCOPY  2007   Dr. Allen Norris  . COLONOSCOPY WITH PROPOFOL N/A 01/18/2016   Procedure: COLONOSCOPY WITH PROPOFOL;  Surgeon: Christene Lye, MD;  Location: [facility] ENDOSCOPY;  Service: Endoscopy;  Laterality: N/A;  . TOOTH EXTRACTION  1970  . wisdom teeth removal  2008   Family History  Problem Relation Age of Onset  . Thyroid disease Mother   . Osteoporosis Mother   . Heart attack Father   .  Hypertension Father   . Heart disease Father   . Breast cancer Paternal Aunt    Social History   Tobacco Use  . Smoking status: Never Smoker  . Smokeless tobacco: Never Used  Substance Use Topics  . Alcohol use: No  . Drug use: No     [facility] Visit from 07/17/2018 in [facility]  AUDIT-C Score  0      Interim medical history since last visit reviewed. Allergies and medications reviewed  Review of Systems Per HPI unless specifically indicated above     Objective:    BP 120/78 (BP Location: Right Arm, Patient Position: Sitting, Cuff Size: Normal)   Pulse 77   Temp 98.6 F (37 C) (Oral)   Resp 16   Ht 5\' 3"  (1.6 m)   Wt 199 lb 6.4 oz (90.4 kg)   LMP  (LMP Unknown)   SpO2 96%   BMI 35.32 kg/m   Wt Readings from Last 3 Encounters:  07/17/18 199 lb 6.4 oz (90.4 kg)  06/13/18 196 lb (88.9 kg)  01/21/18 200 lb (90.7 kg)    Physical Exam Constitutional:      Appearance: She is well-developed. She is obese.  Eyes:     General: No scleral icterus. Cardiovascular:      Rate and Rhythm: Normal rate.     Pulses:          Dorsalis pedis pulses are 1+ on the right side and 1+ on the left side.  Pulmonary:     Effort: Pulmonary effort is normal.  Musculoskeletal:     Right foot: Normal range of motion. No bunion or foot drop.     Left foot: Normal range of motion. No bunion or foot drop.  Feet:     Right foot:     Protective Sensation: 5 sites tested. 5 sites sensed.     Skin integrity: No ulcer, erythema or callus.     Left foot:     Protective Sensation: 5 sites tested. 5 sites sensed.     Skin integrity: No ulcer, erythema or callus.     Comments: Tenderness along the lateral sole of the RIGHT heel; no tenderness along the plantar fascia of either foot Neurological:     Mental Status: She is alert.  Psychiatric:        Behavior: Behavior normal.       Assessment & Plan:   Problem List Items Addressed This Visit    None    Visit Diagnoses    Pain of right heel    -  Primary   not suggestive of gout, neuropathy, plantar fasciitis; ddx includes heel spur; will get xray today and refer to podiatry; supportive cushioned shoes recommended   Relevant Orders   DG Foot Complete Right   Ambulatory referral to Podiatry       Follow up plan: No follow-ups on file.  An after-visit summary was printed and given to the patient at [facility].  Please see the patient instructions which may contain other information and recommendations beyond what is mentioned above in the assessment and plan.  No orders of the defined types were placed in this encounter.   Orders Placed This Encounter  Procedures  . DG Foot Complete Right  . Ambulatory referral to Podiatry

## 2018-07-17 NOTE — Patient Instructions (Signed)
Try turmeric as a natural anti-inflammatory (for pain and arthritis). It comes in capsules where you buy aspirin and fish oil, but also as a spice where you buy pepper and garlic powder. Temperature -- ice or heat, and I will suggest ice right now for the first several days, 15-20 minutes, always have cloth between foot and skin Topicals -- aspercreme or icy hot Tylenol -- per package directions  Please go across the street for xrays We'll contact you when results are available through [place] put in a referral to the podiatrist If you have not heard anything from my staff in a week about any orders/referrals/studies from today, please contact us here to follow-up [phone]

## 2018-07-18 ENCOUNTER — Encounter: Payer: Self-pay | Admitting: Family Medicine

## 2018-07-18 DIAGNOSIS — M7731 Calcaneal spur, right foot: Secondary | ICD-10-CM

## 2018-07-18 HISTORY — DX: Calcaneal spur, right foot: M77.31

## 2018-08-01 ENCOUNTER — Ambulatory Visit: Payer: BC Managed Care – PPO | Admitting: Podiatry

## 2018-08-01 ENCOUNTER — Encounter: Payer: Self-pay | Admitting: Podiatry

## 2018-08-01 ENCOUNTER — Other Ambulatory Visit: Payer: Self-pay

## 2018-08-01 DIAGNOSIS — M722 Plantar fascial fibromatosis: Secondary | ICD-10-CM | POA: Diagnosis not present

## 2018-08-04 NOTE — Progress Notes (Signed)
   HPI: 63 year old female presenting today as a new patient with a chief complaint of stabbing pain to the right heel that began 1.5 weeks ago. She states she was seen by her PCP and diagnosed with a heel spur. She has been using ice therapy and wearing a padded shoe for treatment. She states her pain is significantly better at this time. Patient is here for further evaluation and treatment.   Past Medical History:  Diagnosis Date  . Cancer ([place]) 2012   left breast/ pT1b N0  . Diffuse cystic mastopathy   . Heel spur, right 07/18/2018  . Hypothyroidism   . Personal history of malignant neoplasm of breast 2012  . Personal history of radiation therapy   . Thyroid disease 2010     Physical Exam: General: The patient is alert and oriented x3 in no acute distress.  Dermatology: Skin is warm, dry and supple bilateral lower extremities. Negative for open lesions or macerations.  Vascular: Palpable pedal pulses bilaterally. No edema or erythema noted. Capillary refill within normal limits.  Neurological: Epicritic and protective threshold grossly intact bilaterally.   Musculoskeletal Exam: Range of motion within normal limits to all pedal and ankle joints bilateral. Muscle strength 5/5 in all groups bilateral.   Assessment: 1. Plantar fasciitis right x 1 week - resolved    Plan of Care:  1. Patient evaluated. X-Rays in Epic from 07/18/2018 reviewed.  2. Recommended good shoe gear.  3. Recommended daily stretching exercises.  4. Return to clinic as needed.      Edrick Kins, DPM [facility]  Dr. Edrick Kins, DPM    [address], [place] [address]                Office [phone]  Fax [phone]

## 2018-12-09 ENCOUNTER — Encounter: Payer: Self-pay | Admitting: General Surgery

## 2018-12-16 ENCOUNTER — Encounter: Payer: Self-pay | Admitting: Family Medicine

## 2018-12-16 ENCOUNTER — Other Ambulatory Visit: Payer: Self-pay | Admitting: *Deleted

## 2018-12-16 DIAGNOSIS — Z780 Asymptomatic menopausal state: Secondary | ICD-10-CM

## 2018-12-16 DIAGNOSIS — Z853 Personal history of malignant neoplasm of breast: Secondary | ICD-10-CM

## 2018-12-16 NOTE — Progress Notes (Signed)
Order in [place] and bone

## 2018-12-16 NOTE — Telephone Encounter (Signed)
Pt called in schedule appt as instructed on [facility] message. Pt requests callback at [phone]

## 2018-12-17 ENCOUNTER — Ambulatory Visit (INDEPENDENT_AMBULATORY_CARE_PROVIDER_SITE_OTHER): Payer: BC Managed Care – PPO | Admitting: Nurse Practitioner

## 2018-12-17 ENCOUNTER — Encounter: Payer: Self-pay | Admitting: Nurse Practitioner

## 2018-12-17 ENCOUNTER — Other Ambulatory Visit: Payer: Self-pay

## 2018-12-17 DIAGNOSIS — R05 Cough: Secondary | ICD-10-CM

## 2018-12-17 DIAGNOSIS — Z20828 Contact with and (suspected) exposure to other viral communicable diseases: Secondary | ICD-10-CM

## 2018-12-17 DIAGNOSIS — E669 Obesity, unspecified: Secondary | ICD-10-CM | POA: Diagnosis not present

## 2018-12-17 DIAGNOSIS — Z20822 Contact with and (suspected) exposure to covid-19: Secondary | ICD-10-CM

## 2018-12-17 DIAGNOSIS — R059 Cough, unspecified: Secondary | ICD-10-CM

## 2018-12-17 NOTE — Progress Notes (Signed)
Virtual Visit via Video Note  I connected with Deanna Holder on 12/17/18 at  2:00 PM EDT by a video enabled telemedicine application and verified that I am speaking with the correct person using two identifiers.   Staff discussed the limitations of evaluation and management by telemedicine and the availability of in person appointments. The patient expressed understanding and agreed to proceed.  Patient location: car My location: work office Other people present: none HPI  Patient had a co-worker that tested positive for COVID-19, states she was contacted by job to get tested. Patient was wearing a mask but positive individual was not. 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   Patient denies fevers, chills, shortness of breath, sore throat.  Endorses mild cough- feels its allergy related was worst in Monday.    PHQ2/9: Depression screen [facility] 2/9 12/17/2018 07/17/2018 07/17/2018 06/13/2018 12/09/2017  Decreased Interest 0 0 0 0 0  Down, Depressed, Hopeless 0 0 0 0 0  PHQ - 2 Score 0 0 0 0 0  Altered sleeping 0 0 0 - -  Tired, decreased energy 0 0 0 - -  Change in appetite 0 0 0 - -  Feeling bad or failure about yourself  0 0 0 - -  Trouble concentrating 0 0 0 - -  Moving slowly or fidgety/restless 0 0 0 - -  Suicidal thoughts 0 0 0 - -  PHQ-9 Score 0 0 0 - -  Difficult doing work/chores Not difficult at all Not difficult at all Not difficult at all - -     PHQ reviewed. Negative  Patient Active Problem List   Diagnosis Date Noted  . Heel spur, right 07/18/2018  . Vitamin D deficiency 01/08/2018  . Low vitamin B12 level 01/08/2018  . Dyslipidemia 01/08/2018  . Hyperglycemia 06/13/2015  . Hypothyroidism (acquired) 06/13/2015  . Obesity (BMI 30.0-34.9) 02/07/2015  . Menopause 02/07/2015  . History of breast cancer 01/13/2013  . Allergic rhinitis 10/22/2006    Past Medical History:  Diagnosis Date  . Cancer ([place]) 2012   left breast/ pT1b N0  . Diffuse  cystic mastopathy   . Heel spur, right 07/18/2018  . Hypothyroidism   . Personal history of malignant neoplasm of breast 2012  . Personal history of radiation therapy   . Thyroid disease 2010    Past Surgical History:  Procedure Laterality Date  . 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 BREAST LUMPECTOMY Left Aug 2012   ER/PR POS,HER 2 NEG  . BREAST SURGERY Left 2012   mammosite balloon placement  . CESAREAN SECTION  1985  . COLONOSCOPY  2007   Dr. Allen Norris  . COLONOSCOPY WITH PROPOFOL N/A 01/18/2016   Procedure: COLONOSCOPY WITH PROPOFOL;  Surgeon: Christene Lye, MD;  Location: [facility] ENDOSCOPY;  Service: Endoscopy;  Laterality: N/A;  . TOOTH EXTRACTION  1970  . wisdom teeth removal  2008    Social History   Tobacco Use  . Smoking status: Never Smoker  . Smokeless tobacco: Never Used  Substance Use Topics  . Alcohol use: No     Current Outpatient Medications:  .  Cyanocobalamin (B-12) 1000 MCG SUBL, Place 1 tablet under the tongue daily., Disp: 30 each, Rfl: 5 .  levothyroxine (SYNTHROID, LEVOTHROID) 50 MCG tablet, Take one daily, Disp: 90 tablet, Rfl: 1 .  Multiple Vitamins-Minerals (MULTIVITAMIN ADULT PO), Take by mouth., Disp: , Rfl:   Allergies  Allergen Reactions  . Ampicillin Anaphylaxis  .  Vibramycin [Doxycycline Calcium] Anaphylaxis    ROS   No other specific complaints in a complete review of systems (except as listed in HPI above).  Objective  There were no vitals filed for this visit.   There is no height or weight on file to calculate BMI.  Nursing Note and Vital Signs reviewed.  Physical Exam   Constitutional: Patient appears well-developed and well-nourished. No distress.  HENT: Head: Normocephalic and atraumatic. Pulmonary/Chest: Effort normal  Neurological: alert and oriented, speech normal.  Psychiatric: Patient has a normal mood and affect. behavior is normal. Judgment and thought content normal.    Assessment &  Plan  1. Cough - Novel Coronavirus, NAA ([facility])  2. Close Exposure to Covid-19 Virus - Novel Coronavirus, NAA ([facility])  3. Obesity (BMI 30.0-34.9)  Discussed self-isolation, mask wearing, frequent  Hand washing, if symptoms develop treatment and follow-up  Follow Up Instructions:    I discussed the assessment and treatment plan with the patient. The patient was provided an opportunity to ask questions and all were answered. The patient agreed with the plan and demonstrated an understanding of the instructions.   The patient was advised to call back or seek an in-person evaluation if the symptoms worsen or if the condition fails to improve as anticipated.  I provided  11 minutes of non-face-to-face time during this encounter.   Fredderick Severance, NP

## 2018-12-19 ENCOUNTER — Other Ambulatory Visit: Payer: Self-pay | Admitting: Family Medicine

## 2018-12-19 DIAGNOSIS — E039 Hypothyroidism, unspecified: Secondary | ICD-10-CM

## 2018-12-19 LAB — NOVEL CORONAVIRUS, NAA: SARS-CoV-2, NAA: NOT DETECTED

## 2018-12-19 NOTE — Telephone Encounter (Signed)
Refill request for thyroid medication. Levothyroxine   Last Physical: 06/13/2018   Lab Results  Component Value Date   TSH 2.03 12/09/2017     Follow up on 06/16/2019

## 2019-01-21 ENCOUNTER — Ambulatory Visit
Admission: RE | Admit: 2019-01-21 | Discharge: 2019-01-21 | Disposition: A | Discharge status: Home / Self Care | Payer: BC Managed Care – PPO | Source: Ambulatory Visit | Attending: Surgery | Admitting: Surgery

## 2019-01-21 DIAGNOSIS — Z780 Asymptomatic menopausal state: Secondary | ICD-10-CM | POA: Diagnosis present

## 2019-01-21 DIAGNOSIS — Z853 Personal history of malignant neoplasm of breast: Secondary | ICD-10-CM | POA: Diagnosis present

## 2019-01-27 ENCOUNTER — Other Ambulatory Visit: Payer: Self-pay

## 2019-01-27 ENCOUNTER — Encounter: Payer: Self-pay | Admitting: Surgery

## 2019-01-27 ENCOUNTER — Ambulatory Visit: Payer: BC Managed Care – PPO | Admitting: Surgery

## 2019-01-27 VITALS — BP 142/82 | HR 66 | Temp 97.3°F | Ht 65.0 in | Wt 199.0 lb

## 2019-01-27 DIAGNOSIS — Z853 Personal history of malignant neoplasm of breast: Secondary | ICD-10-CM | POA: Diagnosis not present

## 2019-01-27 NOTE — Patient Instructions (Addendum)
Return to PCP for mammograms and breast checks. The patient is aware to call back for any questions or concerns. Patient may take a vitamin d with calcium.

## 2019-01-27 NOTE — Progress Notes (Signed)
01/27/2019  History of Present Illness: Deanna Holder is a 63 y.o. female with a history of left breast cancer status post lumpectomy in August 2012.  She had her most recent mammogram on 01/21/2019 as well as DEXA scan.  She denies any new palpable masses or concerns.  She does have 1 small area on the skin at the incision in the left breast that intermittently will scab and open.  She has been applying lotion as recommended by Dr. Bary Castilla last year and reports that this has been getting better and is no longer an issue of concern for her.  Past Medical History: Past Medical History:  Diagnosis Date  . Cancer ([place]) 2012   left breast/ pT1b N0  . Diffuse cystic mastopathy   . Heel spur, right 07/18/2018  . Hypothyroidism   . Personal history of malignant neoplasm of breast 2012  . Personal history of radiation therapy   . Thyroid disease 2010     Past Surgical History: Past Surgical History:  Procedure Laterality Date  . 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 BREAST LUMPECTOMY Left Aug 2012   ER/PR POS,HER 2 NEG  . BREAST SURGERY Left 2012   mammosite balloon placement  . CESAREAN SECTION  1985  . COLONOSCOPY  2007   Dr. Allen Norris  . COLONOSCOPY WITH PROPOFOL N/A 01/18/2016   Procedure: COLONOSCOPY WITH PROPOFOL;  Surgeon: Christene Lye, MD;  Location: [facility] ENDOSCOPY;  Service: Endoscopy;  Laterality: N/A;  . TOOTH EXTRACTION  1970  . wisdom teeth removal  2008    Home Medications: Prior to Admission medications   Medication Sig Start Date End Date Taking? Authorizing Provider  Cyanocobalamin (B-12) 1000 MCG SUBL Place 1 tablet under the tongue daily. 06/13/18  Yes Sowles, Drue Stager, MD  levothyroxine (SYNTHROID) 50 MCG tablet TAKE 1 TABLET BY MOUTH EVERY DAY 12/19/18  Yes Sowles, Drue Stager, MD  Multiple Vitamins-Minerals (MULTIVITAMIN ADULT PO) Take by mouth.   Yes [provider]    Allergies: Allergies  Allergen Reactions  . Ampicillin  Anaphylaxis  . Vibramycin [Doxycycline Calcium] Anaphylaxis    Review of Systems: Review of Systems  Constitutional: Negative for chills and fever.  Respiratory: Negative for shortness of breath.   Cardiovascular: Negative for chest pain.  Gastrointestinal: Negative for nausea and vomiting.  Skin:       Scab area of left breast incision site    Physical Exam BP (!) 142/82   Pulse 66   Temp (!) 97.3 F (36.3 C) (Skin)   Ht 5\' 5"  (1.651 m)   Wt 199 lb (90.3 kg)   LMP  (LMP Unknown)   SpO2 98%   BMI 33.12 kg/m  CONSTITUTIONAL: No acute distress HEENT:  Normocephalic, atraumatic, extraocular motion intact. RESPIRATORY:  Lungs are clear, and breath sounds are equal bilaterally. Normal respiratory effort without pathologic use of accessory muscles. CARDIOVASCULAR: Heart is regular without murmurs, gallops, or rubs. BREAST: Left breast status post lumpectomy in the upper inner quadrant which is well-healed except for a small area just inferior to it measuring 3 mm with a recent scab that is nondraining.  No area of infection either.  No palpable masses, other skin changes, or nipple retraction.  Left axillary incision is well-healed with no palpable lymphadenopathy.  No left supraclavicular lymphadenopathy.  On the right, breast exam is negative for any palpable masses, skin changes, or nipple retraction.  No right axillary or supraclavicular lymphadenopathy NEUROLOGIC:  Motor and sensation is grossly  normal.  Cranial nerves are grossly intact. PSYCH:  Alert and oriented to person, place and time. Affect is normal.  Labs/Imaging: Mammogram on 01/21/2019: FINDINGS: There are no findings suspicious for malignancy. Images were processed with CAD.  IMPRESSION: No mammographic evidence of malignancy. A result letter of this screening mammogram will be mailed directly to the patient.  RECOMMENDATION: Screening mammogram in one year. (Code:SM-B-01Y)  DEXA Scan  01/21/19: ASSESSMENT: The probability of a major osteoporotic fracture is 8.9% within the next ten years.  The probability of a hip fracture is 1.0% within the next ten years.  ASSESSMENT: The BMD measured at Femur Neck Left is 0.791 g/cm2 with a T-score of -1.8. This patient's diagnostic category is LOW BONE MASS/OSTEOPENIA  according to [facility]) criteria. Assessment and Plan: This is a 63 y.o. female with a history of left breast cancer status post lumpectomy in 12/2010.  Discussed with patient that she has been doing well from the surgical standpoint with negative mammograms and negative exam.  Recommend that she continue using the lotion as needed for that area of scabbing in the left breast incision.  However from a surgical standpoint, no further follow-ups are needed at this point.  Will defer further mammograms to her PCP at this point.  However if there is any issues in the future of concern we are always available happy to see her.  Discussed with her also her DEXA scan results and at this point she has a T score of -1.8 which puts her in the osteopenia category.  Recommended that she start taking vitamin D and calcium whether it is via Os-Cal or in the multivitamin.  Further DEXA scans can be done with her PCP as well.  This note will be forwarded to her PCP.  Face-to-face time spent with the patient and care providers was 15 minutes, with more than 50% of the time spent counseling, educating, and coordinating care of the patient.     Melvyn Neth, [facility]

## 2019-04-07 ENCOUNTER — Encounter: Payer: Self-pay | Admitting: Family Medicine

## 2019-04-10 ENCOUNTER — Other Ambulatory Visit: Payer: Self-pay

## 2019-04-10 DIAGNOSIS — Z20822 Contact with and (suspected) exposure to covid-19: Secondary | ICD-10-CM

## 2019-04-13 LAB — NOVEL CORONAVIRUS, NAA: SARS-CoV-2, NAA: NOT DETECTED

## 2019-06-16 ENCOUNTER — Ambulatory Visit (INDEPENDENT_AMBULATORY_CARE_PROVIDER_SITE_OTHER): Payer: BC Managed Care – PPO | Admitting: Family Medicine

## 2019-06-16 ENCOUNTER — Encounter: Payer: Self-pay | Admitting: Family Medicine

## 2019-06-16 ENCOUNTER — Other Ambulatory Visit: Payer: Self-pay

## 2019-06-16 VITALS — BP 140/80 | HR 73 | Temp 96.9°F | Resp 16 | Ht 65.0 in | Wt 198.8 lb

## 2019-06-16 DIAGNOSIS — R739 Hyperglycemia, unspecified: Secondary | ICD-10-CM

## 2019-06-16 DIAGNOSIS — Z01419 Encounter for gynecological examination (general) (routine) without abnormal findings: Secondary | ICD-10-CM | POA: Diagnosis not present

## 2019-06-16 DIAGNOSIS — K219 Gastro-esophageal reflux disease without esophagitis: Secondary | ICD-10-CM

## 2019-06-16 DIAGNOSIS — E559 Vitamin D deficiency, unspecified: Secondary | ICD-10-CM

## 2019-06-16 DIAGNOSIS — E039 Hypothyroidism, unspecified: Secondary | ICD-10-CM

## 2019-06-16 DIAGNOSIS — E538 Deficiency of other specified B group vitamins: Secondary | ICD-10-CM

## 2019-06-16 DIAGNOSIS — M8580 Other specified disorders of bone density and structure, unspecified site: Secondary | ICD-10-CM

## 2019-06-16 DIAGNOSIS — E669 Obesity, unspecified: Secondary | ICD-10-CM

## 2019-06-16 DIAGNOSIS — E785 Hyperlipidemia, unspecified: Secondary | ICD-10-CM

## 2019-06-16 DIAGNOSIS — Z780 Asymptomatic menopausal state: Secondary | ICD-10-CM

## 2019-06-16 DIAGNOSIS — R030 Elevated blood-pressure reading, without diagnosis of hypertension: Secondary | ICD-10-CM

## 2019-06-16 MED ORDER — VITAMIN D 50 MCG (2000 UT) PO TABS: 2000.0000 [IU] | ORAL_TABLET | Freq: Every day | ORAL | 0 refills | Status: AC

## 2019-06-16 MED ORDER — LEVOTHYROXINE SODIUM 50 MCG PO TABS: ORAL_TABLET | ORAL | 3 refills | Status: AC

## 2019-06-16 NOTE — Patient Instructions (Signed)

## 2019-06-16 NOTE — Progress Notes (Signed)
Name: Deanna Holder   MRN: [id number]    DOB: [birth year]   Date:06/16/2019       Progress Note  Subjective  Chief Complaint  Chief Complaint  Patient presents with  . Annual Exam    HPI  Patient presents for annual CPE and follow up  History of breast cancer: diagnosed in 2012, she was released from Dr. Festus Aloe care . Stopped [facility] Summer 2017. She denies any breast lumps or nipple discharge , last mammogram was done 12/2018  Osteopenia post-menopausal: also after breast cancer therapy, discussed high calcium diet and vitamin D supplementation, weight bearing exercises and recheck in 2 years  Hypothyroidism: she was diagnosed with hypothyroidism around 2010, taking levothyroxine 50 mcg daily, she denies dry skin, no constipation or hair loss.  TSH has been normal for years, we will recheck it today   GERD: she has noticed a lump in her throat , worse when she drinks sweet tea and better when she drinks water, she only had two episodes of regurgitation in her life time. Discussed life style modification and see if a GERD appropriate diet will control symptoms   Obesity: stable, she has still using [employer] , not walking as much because working too many hours.   Hyperglycemia: she denies polyphagia, polydipsia or polyuria.She is due for repeat A1C today   Hyperlipidemia: she does not need statin therapy, no chest pain or palpitation. We will repeat levels. The 10-year ASCVD risk score Mikey Bussing DC Jr., et al., 2013) is: 6.2%   Values used to calculate the score:     Age: 65 years     Sex: Female     Is Non-Hispanic African American: No     Diabetic: No     Tobacco smoker: No     Systolic Blood Pressure: 096 mmHg     Is BP treated: No     HDL Cholesterol: 58 mg/dL     Total Cholesterol: 227 mg/dL  Elevated bp: it was over 140 on her last visit and today is borderline at 140/80, discussed DASH diet and need to monitor at home, if goes above 140/90 on a regular basis  advised her to return sooner to discuss medication   Diet: she is packing lunch, eat breakfast at home, usually does not eat out, she will try to increase physical activity  Exercise: she needs to increase to 150 minutes per week   USPSTF grade A and B recommendations    Office Visit from 06/16/2019 in [facility]  AUDIT-C Score  0     Depression: Phq 9 is  negative Depression screen [facility] 2/9 06/16/2019 12/17/2018 07/17/2018 07/17/2018 06/13/2018  Decreased Interest 0 0 0 0 0  Down, Depressed, Hopeless 0 0 0 0 0  PHQ - 2 Score 0 0 0 0 0  Altered sleeping 0 0 0 0 -  Tired, decreased energy 0 0 0 0 -  Change in appetite 0 0 0 0 -  Feeling bad or failure about yourself  0 0 0 0 -  Trouble concentrating 0 0 0 0 -  Moving slowly or fidgety/restless 0 0 0 0 -  Suicidal thoughts 0 0 0 0 -  PHQ-9 Score 0 0 0 0 -  Difficult doing work/chores - Not difficult at all Not difficult at all Not difficult at all -   Hypertension: BP Readings from Last 3 Encounters:  06/16/19 140/80  01/27/19 (!) 142/82  07/17/18 120/78   Obesity: Wt Readings  from Last 3 Encounters:  06/16/19 198 lb 12.8 oz (90.2 kg)  01/27/19 199 lb (90.3 kg)  07/17/18 199 lb 6.4 oz (90.4 kg)   BMI Readings from Last 3 Encounters:  06/16/19 33.08 kg/m  01/27/19 33.12 kg/m  07/17/18 35.32 kg/m     Hep C Screening: done 11/2012 STD testing and prevention (HIV/chl/gon/syphilis): N/A Intimate partner violence: negative screen  Sexual History (Partners/Practices/Protection from [employer] hx STI/Pregnancy Plans): not sexually  Pain during Intercourse: not sexually active  Menstrual History/LMP/Abnormal Bleeding: s/p menopause.  Incontinence Symptoms: no problems at this time  Breast cancer:  - Last Mammogram: 01/21/2019 - BRCA gene screening: N/A  Osteoporosis: Discussed high calcium and vitamin D supplementation, weight bearing exercises Bone Density done 01/21/2019 and showed FRAX score of 8.9 %  for major fracture and 1 % for hip fracture  Cervical cancer screening: repeat due in 2022  Skin cancer: Discussed monitoring for atypical lesions  Colorectal cancer: repeat in 2027  Lung cancer:  Low Dose CT Chest recommended if Age 19-80 years, 30 pack-year currently smoking OR have quit w/in 15years. Patient does not qualify.   ECG: 2010  Advanced Care Planning: A voluntary discussion about advance care planning including the explanation and discussion of advance directives.  Discussed health care proxy and Living will, and the patient was able to identify a health care proxy as youngest sone .  Patient has a living will at present time.   Lipids: Lab Results  Component Value Date   CHOL 227 (H) 12/09/2017   CHOL 202 (H) 12/06/2016   CHOL 213 (H) 12/06/2015   Lab Results  Component Value Date   HDL 58 12/09/2017   HDL 61 12/06/2016   HDL 55 12/06/2015   Lab Results  Component Value Date   LDLCALC 146 (H) 12/09/2017   LDLCALC 123 (H) 12/06/2016   LDLCALC 137 (H) 12/06/2015   Lab Results  Component Value Date   TRIG 114 12/09/2017   TRIG 88 12/06/2016   TRIG 107 12/06/2015   Lab Results  Component Value Date   CHOLHDL 3.9 12/09/2017   CHOLHDL 3.3 12/06/2016   CHOLHDL 3.9 12/06/2015   No results found for: LDLDIRECT  Glucose: Glucose  Date Value Ref Range Status  02/04/2014 100 (H) 65 - 99 mg/dL Final  08/06/2013 101 (H) 65 - 99 mg/dL Final  01/26/2013 105 (H) 65 - 99 mg/dL Final   Glucose, Bld  Date Value Ref Range Status  12/09/2017 98 65 - 99 mg/dL Final    Comment:    .            Fasting reference interval .   12/06/2016 97 65 - 99 mg/dL Final  06/08/2016 96 65 - 99 mg/dL Final    Patient Active Problem List   Diagnosis Date Noted  . Heel spur, right 07/18/2018  . Vitamin D deficiency 01/08/2018  . Low vitamin B12 level 01/08/2018  . Dyslipidemia 01/08/2018  . Hyperglycemia 06/13/2015  . Hypothyroidism (acquired) 06/13/2015  . Obesity (BMI  30.0-34.9) 02/07/2015  . Menopause 02/07/2015  . History of breast cancer 01/13/2013  . Allergic rhinitis 10/22/2006    Past Surgical History:  Procedure Laterality Date  . 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 BREAST LUMPECTOMY Left Aug 2012   ER/PR POS,HER 2 NEG  . BREAST SURGERY Left 2012   mammosite balloon placement  . CESAREAN SECTION  1985  . COLONOSCOPY  2007   Dr. Allen Norris  . COLONOSCOPY  WITH PROPOFOL N/A 01/18/2016   Procedure: COLONOSCOPY WITH PROPOFOL;  Surgeon: Christene Lye, MD;  Location: [facility] ENDOSCOPY;  Service: Endoscopy;  Laterality: N/A;  . TOOTH EXTRACTION  1970  . wisdom teeth removal  2008    Family History  Problem Relation Age of Onset  . Thyroid disease Mother   . Osteoporosis Mother   . Heart attack Father   . Hypertension Father   . Heart disease Father   . Breast cancer Paternal Aunt     Social History   Socioeconomic History  . Marital status: Divorced    Spouse name: Not on file  . Number of children: 3  . Years of education: Not on file  . Highest education level: Bachelor's degree (e.g., BA, AB, BS)  Occupational History  . Occupation: Scientist, clinical (histocompatibility and immunogenetics) of court  Tobacco Use  . Smoking status: Never Smoker  . Smokeless tobacco: Never Used  Substance and Sexual Activity  . Alcohol use: No  . Drug use: No  . Sexual activity: Not Currently    Birth control/protection: Abstinence  Other Topics Concern  . Not on file  Social History Narrative   She is divorced, lives alone.    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    Social Determinants of Health   Financial Resource Strain: Low Risk   . Difficulty of Paying Living Expenses: Not hard at all  Food Insecurity: No Food Insecurity  . Worried About Charity fundraiser in the Last Year: Never true  . Ran Out of Food in the Last Year: Never true  Transportation Needs:  No Transportation Needs  . Lack of Transportation (Medical): No  . Lack of Transportation (Non-Medical): No  Physical Activity: Insufficiently Active  . Days of Exercise per Week: 5 days  . Minutes of Exercise per Session: 10 min  Stress: No Stress Concern Present  . Feeling of Stress : Not at all  Social Connections: Not Isolated  . Frequency of Communication with Friends and Family: More than three times a week  . Frequency of Social Gatherings with Friends and Family: More than three times a week  . Attends Religious Services: More than 4 times per year  . Active Member of Clubs or Organizations: Yes  . Attends Archivist Meetings: More than 4 times per year  . Marital Status: Married  Human resources officer Violence: Not At Risk  . Fear of Current or Ex-Partner: No  . Emotionally Abused: No  . Physically Abused: No  . Sexually Abused: No     Current Outpatient Medications:  .  Cyanocobalamin (B-12) 1000 MCG SUBL, Place 1 tablet under the tongue daily., Disp: 30 each, Rfl: 5 .  levothyroxine (SYNTHROID) 50 MCG tablet, TAKE 1 TABLET BY MOUTH EVERY DAY, Disp: 90 tablet, Rfl: 1 .  Multiple Vitamins-Minerals (MULTIVITAMIN ADULT PO), Take by mouth., Disp: , Rfl:   Allergies  Allergen Reactions  . Ampicillin Anaphylaxis  . Vibramycin [Doxycycline Calcium] Anaphylaxis     ROS  Constitutional: Negative for fever or weight change.  Respiratory: Negative for cough and shortness of breath.   Cardiovascular: Negative for chest pain or palpitations.  Gastrointestinal: Negative for abdominal pain, no bowel changes.  Musculoskeletal: Negative for gait problem or joint swelling.  Skin: Negative for rash.  Neurological: Negative for dizziness or headache.  No other specific complaints in a  complete review of systems (except as listed in HPI above).  Objective   Vitals:   06/16/19 0814  BP: 140/80  Pulse: 73  Resp: 16  Temp: (!) 96.9 F (36.1 C)  TempSrc: Temporal  SpO2:  94%  Weight: 198 lb 12.8 oz (90.2 kg)  Height: '5\' 5"'  (1.651 m)    Body mass index is 33.08 kg/m.  Physical Exam  Constitutional: Patient appears well-developed and well-nourished. No distress.  HENT: Head: Normocephalic and atraumatic. Ears: B TMs ok, no erythema or effusion; Nose: Nose normal. Mouth/Throat: not done  Eyes: Conjunctivae and EOM are normal. Pupils are equal, round, and reactive to light. No scleral icterus.  Neck: Normal range of motion. Neck supple. No JVD present. No thyromegaly present.  Cardiovascular: Normal rate, regular rhythm and normal heart sounds.  No murmur heard. No BLE edema. Pulmonary/Chest: Effort normal and breath sounds normal. No respiratory distress. Abdominal: Soft. Bowel sounds are normal, no distension. There is no tenderness. no masses Breast: no lumps or masses scar and telangiectasia on left breast from previous surgery, no nipple discharge or rashes FEMALE GENITALIA:  Not done RECTAL: not done Musculoskeletal: Normal range of motion, no joint effusions. No gross deformities Neurological: he is alert and oriented to person, place, and time. No cranial nerve deficit. Coordination, balance, strength, speech and gait are normal.  Skin: Skin is warm and dry. No rash noted. No erythema. Sees Dermatologist twice a year Psychiatric: Patient has a normal mood and affect. behavior is normal. Judgment and thought content normal.  Recent Results (from the past 2160 hour(s))  Novel Coronavirus, NAA ([facility])     Status: None   Collection Time: 04/10/19  1:47 PM   Specimen: Nasopharyngeal(NP) swabs in vial transport medium   NASOPHARYNGE  TESTING  Result Value Ref Range   SARS-CoV-2, NAA Not Detected Not Detected    Comment: This nucleic acid amplification test was developed and its performance characteristics determined by [employer], [employer]. Nucleic acid amplification tests include PCR and TMA. This test has not been FDA cleared or approved. This  test has been authorized by FDA under an Emergency Use Authorization (EUA). This test is only authorized for the duration of time the declaration that circumstances exist justifying the authorization of the emergency use of in vitro diagnostic tests for detection of SARS-CoV-2 virus and/or diagnosis of COVID-19 infection under section 564(b)(1) of the Act, 21 U.S.C. 767MCN-4(B) (1), unless the authorization is terminated or revoked sooner. When diagnostic testing is negative, the possibility of a false negative result should be considered in the context of a patient's recent exposures and the presence of clinical signs and symptoms consistent with COVID-19. An individual without symptoms of COVID-19 and who is not shedding SARS-CoV-2 virus would  expect to have a negative (not detected) result in this assay.     Fall Risk: Fall Risk  06/16/2019 12/17/2018 07/17/2018 06/13/2018 12/09/2017  Falls in the past year? 0 0 0 0 No  Number falls in past yr: 0 0 0 0 -  Injury with Fall? 0 0 0 0 -  Follow up - - Falls evaluation completed - -     Functional Status Survey: Is the patient deaf or have difficulty hearing?: No Does the patient have difficulty seeing, even when wearing glasses/contacts?: No Does the patient have difficulty concentrating, remembering, or making decisions?: No Does the patient have difficulty walking or climbing stairs?: No Does the patient have difficulty dressing or bathing?: No Does the patient have difficulty  doing errands alone such as visiting a doctor's office or shopping?: No   Assessment & Plan  1. Hyperglycemia  - Hemoglobin A1c  2. Dyslipidemia  - Lipid panel  3. Vitamin D deficiency  - VITAMIN D 25 Hydroxy (Vit-D Deficiency, Fractures)  4. Hypothyroidism (acquired)  - TSH Refill sent to pharmacy  5. B12 deficiency  - Vitamin B12 - CBC with Differential/Platelet  6. Obesity (BMI 30.0-34.9)   7. Well woman exam  - CBC with  Differential/Platelet  8. Elevated BP without diagnosis of hypertension  - COMPLETE METABOLIC PANEL WITH GFR - CBC with Differential/Platelet  9. Osteopenia after menopause   10. Gastroesophageal reflux disease without esophagitis  Discussed GERD appropriate diet   -USPSTF grade A and B recommendations reviewed with patient; age-appropriate recommendations, preventive care, screening tests, etc discussed and encouraged; healthy living encouraged; see AVS for patient education given to patient -Discussed importance of 150 minutes of physical activity weekly, eat two servings of fish weekly, eat one serving of tree nuts ( cashews, pistachios, pecans, almonds.Marland Kitchen) every other day, eat 6 servings of fruit/vegetables daily and drink plenty of water and avoid sweet beverages.

## 2019-06-17 LAB — COMPLETE METABOLIC PANEL WITH GFR
AG Ratio: 1.6 (calc) (ref 1.0–2.5)
ALT: 15 U/L (ref 6–29)
AST: 14 U/L (ref 10–35)
Albumin: 4.2 g/dL (ref 3.6–5.1)
Alkaline phosphatase (APISO): 82 U/L (ref 37–153)
BUN: 13 mg/dL (ref 7–25)
CO2: 28 mmol/L (ref 20–32)
Calcium: 9.6 mg/dL (ref 8.6–10.4)
Chloride: 102 mmol/L (ref 98–110)
Creat: 0.85 mg/dL (ref 0.50–0.99)
GFR, Est African American: 84 mL/min/{1.73_m2} (ref >=60)
GFR, Est Non African American: 72 mL/min/{1.73_m2} (ref >=60)
Globulin: 2.6 g/dL (calc) (ref 1.9–3.7)
Glucose, Bld: 91 mg/dL (ref 65–99)
Potassium: 4.6 mmol/L (ref 3.5–5.3)
Sodium: 141 mmol/L (ref 135–146)
Total Bilirubin: 0.5 mg/dL (ref 0.2–1.2)
Total Protein: 6.8 g/dL (ref 6.1–8.1)

## 2019-06-17 LAB — CBC WITH DIFFERENTIAL/PLATELET
Absolute Monocytes: 566 cells/uL (ref 200–950)
Basophils Absolute: 28 cells/uL (ref 0–200)
Basophils Relative: 0.4 %
Eosinophils Absolute: 138 cells/uL (ref 15–500)
Eosinophils Relative: 2 %
HCT: 39.9 % (ref 35.0–45.0)
Hemoglobin: 13.2 g/dL (ref 11.7–15.5)
Lymphs Abs: 1670 cells/uL (ref 850–3900)
MCH: 29.7 pg (ref 27.0–33.0)
MCHC: 33.1 g/dL (ref 32.0–36.0)
MCV: 89.7 fL (ref 80.0–100.0)
MPV: 10.3 fL (ref 7.5–12.5)
Monocytes Relative: 8.2 %
Neutro Abs: 4499 cells/uL (ref 1500–7800)
Neutrophils Relative %: 65.2 %
Platelets: 252 10*3/uL (ref 140–400)
RBC: 4.45 10*6/uL (ref 3.80–5.10)
RDW: 12 % (ref 11.0–15.0)
Total Lymphocyte: 24.2 %
WBC: 6.9 10*3/uL (ref 3.8–10.8)

## 2019-06-17 LAB — HEMOGLOBIN A1C
Hgb A1c MFr Bld: 6 % of total Hgb — ABNORMAL HIGH (ref <5.7)
Mean Plasma Glucose: 126 (calc)
eAG (mmol/L): 7 (calc)

## 2019-06-17 LAB — LIPID PANEL
Cholesterol: 232 mg/dL — ABNORMAL HIGH (ref <200)
HDL: 59 mg/dL (ref >=50)
LDL Cholesterol (Calc): 149 mg/dL (calc) — ABNORMAL HIGH
Non-HDL Cholesterol (Calc): 173 mg/dL (calc) — ABNORMAL HIGH (ref <130)
Total CHOL/HDL Ratio: 3.9 (calc) (ref <5.0)
Triglycerides: 118 mg/dL (ref <150)

## 2019-06-17 LAB — TSH: TSH: 3.15 mIU/L (ref 0.40–4.50)

## 2019-06-17 LAB — VITAMIN B12: Vitamin B-12: 408 pg/mL (ref 200–1100)

## 2019-06-17 LAB — VITAMIN D 25 HYDROXY (VIT D DEFICIENCY, FRACTURES): Vit D, 25-Hydroxy: 17 ng/mL — ABNORMAL LOW (ref 30–100)

## 2019-06-21 ENCOUNTER — Other Ambulatory Visit: Payer: Self-pay | Admitting: Family Medicine

## 2019-06-21 MED ORDER — VITAMIN D (ERGOCALCIFEROL) 1.25 MG (50000 UNIT) PO CAPS: 50000.0000 [IU] | ORAL_CAPSULE | ORAL | 0 refills | Status: DC

## 2019-07-25 ENCOUNTER — Other Ambulatory Visit: Payer: Self-pay

## 2019-07-25 ENCOUNTER — Ambulatory Visit: Payer: BC Managed Care – PPO | Attending: Internal Medicine

## 2019-07-25 DIAGNOSIS — Z23 Encounter for immunization: Secondary | ICD-10-CM

## 2019-07-25 NOTE — Progress Notes (Signed)
   Covid-19 Vaccination Clinic  Name:  CHRISTINA BARBATI    MRN: [id number] DOB: 11-27-1955  07/25/2019  Ms. Tenpenny was observed post Covid-19 immunization for 15 minutes without incident. She was provided with Vaccine Information Sheet and instruction to access the V-Safe system.   Ms. Ostrovsky was instructed to call 911 with any severe reactions post vaccine: Marland Kitchen Difficulty breathing  . Swelling of face and throat  . A fast heartbeat  . A bad rash all over body  . Dizziness and weakness   Immunizations Administered    Name Date Dose VIS Date Route   Pfizer COVID-19 Vaccine 07/25/2019 10:44 AM 0.3 mL 05/01/2019 Intramuscular   Manufacturer: [place]   Lot: [id number]   [place]: [id number]

## 2019-08-07 ENCOUNTER — Other Ambulatory Visit: Payer: Self-pay | Admitting: Family Medicine

## 2019-08-07 DIAGNOSIS — E2839 Other primary ovarian failure: Secondary | ICD-10-CM

## 2019-08-07 DIAGNOSIS — M8580 Other specified disorders of bone density and structure, unspecified site: Secondary | ICD-10-CM

## 2019-08-07 DIAGNOSIS — Z780 Asymptomatic menopausal state: Secondary | ICD-10-CM

## 2019-08-18 ENCOUNTER — Ambulatory Visit: Payer: BC Managed Care – PPO | Attending: Internal Medicine

## 2019-08-18 DIAGNOSIS — Z23 Encounter for immunization: Secondary | ICD-10-CM

## 2019-08-18 NOTE — Progress Notes (Signed)
   Covid-19 Vaccination Clinic  Name:  Deanna Holder    MRN: [id number] DOB: 06-04-1955  08/18/2019  [name] was observed post Covid-19 immunization for 15 minutes without incident. She was provided with Vaccine Information Sheet and instruction to access the V-Safe system.   [name] was instructed to call 911 with any severe reactions post vaccine: Marland Kitchen Difficulty breathing  . Swelling of face and throat  . A fast heartbeat  . A bad rash all over body  . Dizziness and weakness   Immunizations Administered    Name Date Dose VIS Date Route   Pfizer COVID-19 Vaccine 08/18/2019  8:25 AM 0.3 mL 05/01/2019 Intramuscular   Manufacturer: [place]   Lot: [id number]   [place]: [id number]

## 2019-09-19 ENCOUNTER — Other Ambulatory Visit: Payer: Self-pay | Admitting: Family Medicine

## 2019-09-19 NOTE — Telephone Encounter (Signed)
Requested medication (s) are due for refill today: yes  Requested medication (s) are on the active medication list: yes  Last refill:  05/24/19  Future visit scheduled: yes  Notes to clinic:  medication not delegated to NT to refill.   Requested Prescriptions  Pending Prescriptions Disp Refills   Vitamin D, Ergocalciferol, (DRISDOL) 1.25 MG (50000 UNIT) CAPS capsule [Pharmacy Med Name: VITAMIN D2 1.25MG (50,000 UNIT)] 12 capsule 0    Sig: Take 1 capsule (50,000 Units total) by mouth every 7 (seven) days.      Endocrinology:  Vitamins - Vitamin D Supplementation Failed - 09/19/2019  9:25 AM      Failed - 50,000 IU strengths are not delegated      Failed - Phosphate in normal range and within 360 days    No results found for: PHOS        Failed - Vitamin D in normal range and within 360 days    Vit D, 25-Hydroxy  Date Value Ref Range Status  06/16/2019 17 (L) 30 - 100 ng/mL Final    Comment:    Vitamin D Status         25-OH Vitamin D: . Deficiency:                    <20 ng/mL Insufficiency:             20 - 29 ng/mL Optimal:                 > or = 30 ng/mL . For 25-OH Vitamin D testing on patients on  D2-supplementation and patients for whom quantitation  of D2 and D3 fractions is required, the [facility](TM) 25-OH VIT D, (D2,D3), LC/MS/MS is recommended: order  code [phone] (patients >67yrs). See Note 1 . Note 1 . For additional information, please refer to  http://education.[facility].com/faq/FAQ199  (This link is being provided for informational/ educational purposes only.)           Passed - Ca in normal range and within 360 days    Calcium  Date Value Ref Range Status  06/16/2019 9.6 8.6 - 10.4 mg/dL Final   Calcium, Total  Date Value Ref Range Status  02/04/2014 9.7 8.5 - 10.1 mg/dL Final          Passed - Valid encounter within last 12 months    Recent Outpatient Visits           3 months ago Hyperglycemia   [facility]  Steele Sizer, MD   9 months ago Cough   [place], NP   1 year ago Pain of right heel   [facility] Arnetha Courser, MD   1 year ago Well woman exam   [facility] Steele Sizer, MD   1 year ago Dyslipidemia   [facility] Steele Sizer, MD       Future Appointments             In 9 months Steele Sizer, MD [facility]

## 2019-11-14 ENCOUNTER — Encounter: Payer: Self-pay | Admitting: Family Medicine

## 2020-03-30 ENCOUNTER — Other Ambulatory Visit: Payer: Self-pay | Admitting: Family Medicine

## 2020-06-20 ENCOUNTER — Encounter: Payer: BC Managed Care – PPO | Admitting: Family Medicine
